# Patient Record
Sex: Female | Born: 1950 | Race: White | Hispanic: No | Smoking: Former smoker
Health system: Southern US, Community
[De-identification: ages and names within clinical notes are randomized; demographics above are authoritative.]

## PROBLEM LIST (undated history)

## (undated) DIAGNOSIS — I219 Acute myocardial infarction, unspecified: Secondary | ICD-10-CM

## (undated) DIAGNOSIS — K5904 Chronic idiopathic constipation: Secondary | ICD-10-CM

## (undated) DIAGNOSIS — M5137 Other intervertebral disc degeneration, lumbosacral region: Secondary | ICD-10-CM

## (undated) DIAGNOSIS — I2510 Atherosclerotic heart disease of native coronary artery without angina pectoris: Secondary | ICD-10-CM

## (undated) DIAGNOSIS — M1990 Unspecified osteoarthritis, unspecified site: Secondary | ICD-10-CM

## (undated) DIAGNOSIS — Z720 Tobacco use: Secondary | ICD-10-CM

## (undated) DIAGNOSIS — M549 Dorsalgia, unspecified: Secondary | ICD-10-CM

## (undated) DIAGNOSIS — E782 Mixed hyperlipidemia: Secondary | ICD-10-CM

## (undated) DIAGNOSIS — R0602 Shortness of breath: Secondary | ICD-10-CM

## (undated) DIAGNOSIS — G8929 Other chronic pain: Secondary | ICD-10-CM

## (undated) DIAGNOSIS — J449 Chronic obstructive pulmonary disease, unspecified: Secondary | ICD-10-CM

## (undated) HISTORY — PX: TONSILLECTOMY: SUR1361

## (undated) HISTORY — PX: OTHER SURGICAL HISTORY: SHX169

## (undated) HISTORY — PX: ABDOMINAL HYSTERECTOMY: SHX81

## (undated) HISTORY — DX: Acute myocardial infarction, unspecified: I21.9

## (undated) HISTORY — DX: Other intervertebral disc degeneration, lumbosacral region: M51.37

## (undated) HISTORY — DX: Tobacco use: Z72.0

## (undated) HISTORY — DX: Other chronic pain: G89.29

## (undated) HISTORY — DX: Dorsalgia, unspecified: M54.9

## (undated) HISTORY — PX: DILATION AND CURETTAGE OF UTERUS: SHX78

## (undated) HISTORY — PX: APPENDECTOMY: SHX54

## (undated) HISTORY — PX: LEG SURGERY: SHX1003

## (undated) HISTORY — PX: CARDIAC CATHETERIZATION: SHX172

## (undated) HISTORY — DX: Mixed hyperlipidemia: E78.2

---

## 1998-08-18 ENCOUNTER — Encounter: Payer: Self-pay | Admitting: Orthopedic Surgery

## 1998-08-18 ENCOUNTER — Ambulatory Visit (HOSPITAL_COMMUNITY): Admission: RE | Admit: 1998-08-18 | Discharge: 1998-08-18 | Payer: Self-pay | Admitting: Orthopedic Surgery

## 1998-09-01 ENCOUNTER — Encounter: Payer: Self-pay | Admitting: Orthopedic Surgery

## 1998-09-01 ENCOUNTER — Ambulatory Visit (HOSPITAL_COMMUNITY): Admission: RE | Admit: 1998-09-01 | Discharge: 1998-09-01 | Payer: Self-pay | Admitting: Orthopedic Surgery

## 1998-09-15 ENCOUNTER — Ambulatory Visit (HOSPITAL_COMMUNITY): Admission: RE | Admit: 1998-09-15 | Discharge: 1998-09-15 | Payer: Self-pay | Admitting: Orthopedic Surgery

## 1998-09-15 ENCOUNTER — Encounter: Payer: Self-pay | Admitting: Orthopedic Surgery

## 1999-02-04 ENCOUNTER — Other Ambulatory Visit: Admission: RE | Admit: 1999-02-04 | Discharge: 1999-02-04 | Payer: Self-pay | Admitting: Obstetrics and Gynecology

## 2001-04-06 ENCOUNTER — Other Ambulatory Visit: Admission: RE | Admit: 2001-04-06 | Discharge: 2001-04-06 | Payer: Self-pay | Admitting: Obstetrics and Gynecology

## 2001-04-26 HISTORY — PX: OTHER SURGICAL HISTORY: SHX169

## 2002-10-22 ENCOUNTER — Other Ambulatory Visit: Admission: RE | Admit: 2002-10-22 | Discharge: 2002-10-22 | Payer: Self-pay | Admitting: Obstetrics and Gynecology

## 2013-08-02 ENCOUNTER — Telehealth: Payer: Self-pay | Admitting: Critical Care Medicine

## 2013-08-02 NOTE — Telephone Encounter (Signed)
Schedules a con appt w/ PW in [place] for 08/14/13 @ 9:30 AM.  Aware to have pt arrive 15 mins early & to bring any current meds.  Verbalized understanding & states nothing further needed at this time.  [name]

## 2013-08-14 ENCOUNTER — Encounter (HOSPITAL_COMMUNITY): Payer: Self-pay | Admitting: Pharmacy Technician

## 2013-08-14 ENCOUNTER — Ambulatory Visit (INDEPENDENT_AMBULATORY_CARE_PROVIDER_SITE_OTHER): Payer: Medicare Other | Admitting: Critical Care Medicine

## 2013-08-14 ENCOUNTER — Encounter: Payer: Self-pay | Admitting: Critical Care Medicine

## 2013-08-14 ENCOUNTER — Institutional Professional Consult (permissible substitution): Payer: Self-pay | Admitting: Internal Medicine

## 2013-08-14 VITALS — BP 142/78 | HR 88 | Temp 97.4°F | Ht 64.0 in | Wt 89.0 lb

## 2013-08-14 DIAGNOSIS — G894 Chronic pain syndrome: Secondary | ICD-10-CM | POA: Insufficient documentation

## 2013-08-14 DIAGNOSIS — J439 Emphysema, unspecified: Secondary | ICD-10-CM | POA: Insufficient documentation

## 2013-08-14 DIAGNOSIS — I2510 Atherosclerotic heart disease of native coronary artery without angina pectoris: Secondary | ICD-10-CM | POA: Insufficient documentation

## 2013-08-14 DIAGNOSIS — R599 Enlarged lymph nodes, unspecified: Secondary | ICD-10-CM

## 2013-08-14 DIAGNOSIS — F17200 Nicotine dependence, unspecified, uncomplicated: Secondary | ICD-10-CM

## 2013-08-14 DIAGNOSIS — E43 Unspecified severe protein-calorie malnutrition: Secondary | ICD-10-CM | POA: Insufficient documentation

## 2013-08-14 DIAGNOSIS — R590 Localized enlarged lymph nodes: Secondary | ICD-10-CM | POA: Insufficient documentation

## 2013-08-14 NOTE — Assessment & Plan Note (Signed)
Hilar and mediastinal lymphadenopathy with associated Copd and emphysema Plan Bronchoscopy with endobronchial u/s  (EBUS) in [place] with Dr Janeece Agee PET scan later

## 2013-08-14 NOTE — Progress Notes (Signed)
Subjective:    Patient ID: Chloe Mosley, female    DOB: 1951/03/03, 63 y.o.   MRN: [id number]  Shortness of Breath This is a new problem. The current episode started more than 1 month ago. Episode frequency: notes dyspnea at rest and exertion. The problem has been gradually worsening. Associated symptoms include chest pain, a fever, rhinorrhea, sputum production and wheezing. Pertinent negatives include no abdominal pain, claudication, coryza, ear pain, headaches, hemoptysis, leg pain, leg swelling, neck pain, orthopnea, PND, rash, sore throat, swollen glands, syncope or vomiting. The symptoms are aggravated by any activity and exercise. Associated symptoms comments: Weight loss 5# in one week Started under Left breast and across chest through ribs . Risk factors include smoking. She has tried beta agonist inhalers for the symptoms. The treatment provided moderate relief. Her past medical history is significant for COPD. There is no history of allergies, aspirin allergies, asthma, bronchiolitis, CAD, chronic lung disease, DVT, a heart failure, PE, pneumonia or a recent surgery.   Chest pain.  Abn CT scan  Pt noting chest pain for 18months, now worse. Notes some cough, green mucus.  Also notes dyspnea. Smokes 2PPD  Could not afford/no insurance for chantix  Past Medical History  Diagnosis Date  . Heart attack   . Chronic back pain      Family History  Problem Relation Age of Onset  . Rheum arthritis Paternal Grandmother   . Lung cancer Son   . Osteoporosis Maternal Grandmother      History   Social History  . Marital Status: Married    Spouse Name: N/A    Number of Children: N/A  . Years of Education: N/A   Occupational History  . Not on file.   Social History Main Topics  . Smoking status: Current Every Day Smoker -- 2.00 packs/day    Types: Cigarettes    Start date: 04/26/1965  . Smokeless tobacco: Never Used  . Alcohol Use: No  . Drug Use: No  . Sexual Activity: Not  on file   Other Topics Concern  . Not on file   Social History Narrative  . No narrative on file     No Known Allergies   No outpatient prescriptions prior to visit.   No facility-administered medications prior to visit.     Review of Systems  Constitutional: Positive for fever, activity change, appetite change, fatigue and unexpected weight change.  HENT: Positive for postnasal drip and rhinorrhea. Negative for ear pain, sinus pressure, sneezing and sore throat.   Respiratory: Positive for cough, sputum production, chest tightness, shortness of breath and wheezing. Negative for hemoptysis and choking.   Cardiovascular: Positive for chest pain. Negative for orthopnea, claudication, leg swelling, syncope and PND.  Gastrointestinal: Negative.  Negative for vomiting and abdominal pain.  Musculoskeletal: Negative for neck pain.  Skin: Negative for rash.  Neurological: Negative for headaches.       Objective:   Physical Exam Filed Vitals:   08/14/13 1315  BP: 142/78  Pulse: 88  Temp: 97.4 F (36.3 C)  TempSrc: Oral  Height: 5\' 4"  (1.626 m)  Weight: 89 lb (40.37 kg)  SpO2: 97%    Gen: Pleasant, thin Female cachectic, in no distress,  normal affect  ENT: No lesions,  mouth clear,  oropharynx clear, no postnasal drip  Neck: No JVD, no TMG, no carotid bruits  Lungs: No use of accessory muscles, no dullness to percussion, distant bs  Cardiovascular: RRR, heart sounds normal, no murmur or  gallops, no peripheral edema  Abdomen: soft and NT, no HSM,  BS normal  Musculoskeletal: No deformities, no cyanosis or clubbing  Neuro: alert, non focal  Skin: Warm, no lesions or rashes  No results found.  07/30/13  Ct chest reviewed: mediastinal and hilar lymphadenopathy Emphysematous changes.  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 Plan No change in medications Use ventolin two puff every 4-6 hours as needed    Updated Medication List Outpatient Encounter Prescriptions as of 08/14/2013  Medication Sig  . albuterol (VENTOLIN HFA) 108 (90 BASE) MCG/ACT inhaler Inhale 2 puffs into the lungs every 4 (four) hours as needed for wheezing or shortness of breath.  . Fluticasone-Salmeterol (ADVAIR) 250-50 MCG/DOSE AEPB Inhale 1 puff into the lungs 2 (two) times daily.  Marland Kitchen. gabapentin (NEURONTIN) 400 MG capsule Take 400 mg by mouth 4 (four) times daily.  Marland Kitchen. morphine (MS CONTIN) 60 MG 12 hr tablet Take 60 mg by mouth 3 (three) times daily.  Marland Kitchen. oxyCODONE (ROXICODONE) 15 MG immediate release tablet Take 15 mg by mouth 4 (four) times daily.  Marland Kitchen. tiotropium (SPIRIVA) 18 MCG inhalation capsule Place 18 mcg into inhaler and inhale daily.

## 2013-08-14 NOTE — Patient Instructions (Addendum)
No change in medications A bronchoscopy in [place] will be arranged with Dr Clair Gulling Work on smoking cessation therapy with nicotine replacement Return 1 month

## 2013-08-14 NOTE — Assessment & Plan Note (Signed)
Tobacco cessation counseling issued

## 2013-08-14 NOTE — Assessment & Plan Note (Signed)
Primary emphysema on spiriva/ advair. Plan No change in medications Use ventolin two puff every 4-6 hours as needed

## 2013-08-14 NOTE — Assessment & Plan Note (Signed)
Severe prot cal malnutrition Plan  Nutritional supp

## 2013-08-15 NOTE — H&P (Signed)
INTERVENTIONAL PULMONOLOGY CONSULTATION  Name: Chloe Mosley MRN: [id number] DOB: July 22, 1950    CONSULTATION DATE:  08/17/2013  REFERRING MD :  Dr. Delford Field  CHIEF COMPLAINT:  Mediastinal / hilar lymphadenopathy  HISTORY OF PRESENT ILLNESS:  63 yo active smoker ( 2 packs per day ) with COPD and baseline dyspnea at rest / worse on exertion, productive cough, wheezing and chest wall pain.  Denies hemoptysis.  Reports unspecified weight loss. Noted to have mediastinal / hilar ;ymphadenopathy on chest CT scan dated 07/30/2013.  No such changes noted on chest CT dated 06/27/2009.  PAST MEDICAL HISTORY :  Past Medical History  Diagnosis Date  . Heart attack   . Chronic back pain   . Coronary artery disease   . COPD (chronic obstructive pulmonary disease)   . Shortness of breath   . Arthritis   . Constipation - functional    Past Surgical History  Procedure Laterality Date  . Ovarain cysts      x 3  . Stents place  2003  . Hysterectomy    . Appendectomy    . Dilation and curettage of uterus      x 3  . Leg surgery    . Abdominal hysterectomy    . Cardiac catheterization    . Tonsillectomy     Prior to Admission medications   Medication Sig Start Date End Date Taking? Authorizing Provider  albuterol (VENTOLIN HFA) 108 (90 BASE) MCG/ACT inhaler Inhale 2 puffs into the lungs every 4 (four) hours as needed for wheezing or shortness of breath.   Yes Historical Provider, MD  Fluticasone-Salmeterol (ADVAIR) 250-50 MCG/DOSE AEPB Inhale 1 puff into the lungs 2 (two) times daily.   Yes Historical Provider, MD  gabapentin (NEURONTIN) 400 MG capsule Take 400 mg by mouth 4 (four) times daily.   Yes Historical Provider, MD  morphine (MS CONTIN) 60 MG 12 hr tablet Take 60 mg by mouth 3 (three) times daily.   Yes Historical Provider, MD  oxyCODONE (ROXICODONE) 15 MG immediate release tablet Take 15 mg by mouth 4 (four) times daily.   Yes Historical Provider, MD  tiotropium (SPIRIVA) 18 MCG inhalation  capsule Place 18 mcg into inhaler and inhale daily.   Yes Historical Provider, MD   Allergies  Allergen Reactions  . Chlorhexidine Gluconate Itching    FAMILY HISTORY:  Family History  Problem Relation Age of Onset  . Rheum arthritis Paternal Grandmother   . Lung cancer Son   . Osteoporosis Maternal Grandmother    SOCIAL HISTORY:  reports that she has been smoking Cigarettes.  She started smoking about 48 years ago. She has a 80 pack-year smoking history. She has never used smokeless tobacco. She reports that she does not drink alcohol or use illicit drugs.  REVIEW OF SYSTEMS:   Constitutional: Positive for fatigue, weight loss. Negative for fever, chills, and diaphoresis.  HENT: Positive for nasal discharge and postnasal drip. Negative for hearing loss, ear pain, nosebleeds, congestion, sore throat, neck pain, tinnitus and ear discharge.   Eyes: Negative for blurred vision, double vision, photophobia, pain, discharge and redness.  Respiratory: Positive for productive cough, dyspnea at rest and on exertion, wheezing. Negative hemoptysis, and stridor.   Cardiovascular: Negative for chest pain, palpitations, orthopnea, claudication, leg swelling and PND.  Gastrointestinal: Negative for heartburn, nausea, vomiting, abdominal pain, diarrhea, constipation, blood in stool and melena.  Genitourinary: Negative for dysuria, urgency, frequency, hematuria and flank pain.  Musculoskeletal: Positive for chest wall pain. Negative for  myalgias, back pain, joint pain and falls.  Skin: Negative for itching and rash.  Neurological: Negative for dizziness, tingling, tremors, sensory change, speech change, focal weakness, seizures, loss of consciousness, weakness and headaches.  Endo/Heme/Allergies: Negative for environmental allergies and polydipsia. Does not bruise/bleed easily.  VITAL SIGNS: Temp:  [97.6 F (36.4 C)] 97.6 F (36.4 C) (04/24 1322) Pulse Rate:  [92] 92 (04/24 1322) Resp:  [18] 18  (04/24 1322) BP: (164)/(77) 164/77 mmHg (04/24 1322) SpO2:  [100 %] 100 % (04/24 1322) Weight:  [40.824 kg (90 lb)] 40.824 kg (90 lb) (04/24 1322)  PHYSICAL EXAMINATION: General:  Resting comfortably, appears to be in no acute distress Neuro:  Awake, alert, cooperative with examination, non-focal HEENT:  NCAT, PERRL, moist membranes Neck:  Soft, no bruits, no lymphadenopathy, no carotid btuits Cardiovascular:  RRR, no m/r/g Lungs:  Bilateral air entry, no added sounds Abdomen:  Soft, nontender, bowel sounds present, no organomegaly Musculoskeletal:  Moves all extremities, no edema, no digital clubbing Skin:  Intact, no rash  Recent Labs Lab 08/16/13 1407  NA 136*  K 4.3  CL 97  CO2 25  BUN 7  CREATININE 0.58  GLUCOSE 100*    Recent Labs Lab 08/16/13 1407  HGB 15.2*  HCT 43.5  WBC 13.4*  PLT 444*   IMAGING:  07/30/2013  CT scan >>> No pulmonary embolism.  Extensive emphysema.  Since the prior CT scan, the patient has developed mediastinal and hilar lymphadenopathy. Right hilar node on image 54 measures 2.5 x 1.5 cm. Subcarinal node on image 56 measures 3.3 x 1.6 cm. AP window lymph node on image 43 measures 1.1 x 1.2 cm. Left hilar lymph node on image 55 measures 1.3 x 1.2 cm.    ASSESSMENT / PLAN:  Extensive mediastinal hilar adenopathy, highly suspicious for primary bronchogenic carcinoma ( metastatic disease from extrapulmonary source, lymphoma, or reactive lymphadenopathy are also possible ) COPD without evidence of exacerbation Tobacco use disorder   EBUS-guided TBNA of bilateral hilar and subcarinal lymph nodes  May need to send samples for flow cytometry  Smoking cessation  COPD management per Dr. Delford FieldWright   I have personally obtained history, examined patient, evaluated and interpreted laboratory and imaging results, reviewed medical records, formulated assessment / plan and placed orders.  Lonia FarberKonstantin Rhyanna Sorce, MD Pulmonary and Critical Care  Medicine [facility] Pager: [phone]  08/17/2013, 2:00 PM

## 2013-08-16 ENCOUNTER — Ambulatory Visit (HOSPITAL_COMMUNITY)
Admission: RE | Admit: 2013-08-16 | Discharge: 2013-08-16 | Disposition: A | Discharge status: Home / Self Care | Payer: Medicare Other | Source: Ambulatory Visit | Attending: Anesthesiology | Admitting: Anesthesiology

## 2013-08-16 ENCOUNTER — Encounter (HOSPITAL_COMMUNITY): Payer: Self-pay

## 2013-08-16 ENCOUNTER — Encounter (HOSPITAL_COMMUNITY)
Admission: RE | Admit: 2013-08-16 | Discharge: 2013-08-16 | Disposition: A | Discharge status: Home / Self Care | Payer: Medicare Other | Source: Ambulatory Visit | Attending: Pulmonary Disease | Admitting: Pulmonary Disease

## 2013-08-16 DIAGNOSIS — Z01818 Encounter for other preprocedural examination: Secondary | ICD-10-CM

## 2013-08-16 DIAGNOSIS — Z01812 Encounter for preprocedural laboratory examination: Secondary | ICD-10-CM | POA: Insufficient documentation

## 2013-08-16 DIAGNOSIS — Z01810 Encounter for preprocedural cardiovascular examination: Secondary | ICD-10-CM | POA: Insufficient documentation

## 2013-08-16 HISTORY — DX: Chronic obstructive pulmonary disease, unspecified: J44.9

## 2013-08-16 HISTORY — DX: Atherosclerotic heart disease of native coronary artery without angina pectoris: I25.10

## 2013-08-16 HISTORY — DX: Shortness of breath: R06.02

## 2013-08-16 HISTORY — DX: Chronic idiopathic constipation: K59.04

## 2013-08-16 HISTORY — DX: Unspecified osteoarthritis, unspecified site: M19.90

## 2013-08-16 LAB — COMPREHENSIVE METABOLIC PANEL
ALBUMIN: 3.2 g/dL — AB (ref 3.5–5.2)
ALT: 10 U/L (ref 0–35)
AST: 16 U/L (ref 0–37)
Alkaline Phosphatase: 88 U/L (ref 39–117)
BUN: 7 mg/dL (ref 6–23)
CO2: 25 mEq/L (ref 19–32)
CREATININE: 0.58 mg/dL (ref 0.50–1.10)
Calcium: 8.8 mg/dL (ref 8.4–10.5)
Chloride: 97 mEq/L (ref 96–112)
GFR calc non Af Amer: >90 mL/min (ref >90)
GLUCOSE: 100 mg/dL — AB (ref 70–99)
Potassium: 4.3 mEq/L (ref 3.7–5.3)
Sodium: 136 mEq/L — ABNORMAL LOW (ref 137–147)
Total Bilirubin: 0.2 mg/dL — ABNORMAL LOW (ref 0.3–1.2)
Total Protein: 7 g/dL (ref 6.0–8.3)

## 2013-08-16 LAB — APTT: APTT: 30 s (ref 24–37)

## 2013-08-16 LAB — PROTIME-INR
INR: 1.03 (ref 0.00–1.49)
Prothrombin Time: 13.3 seconds (ref 11.6–15.2)

## 2013-08-16 LAB — CBC
HEMATOCRIT: 43.5 % (ref 36.0–46.0)
Hemoglobin: 15.2 g/dL — ABNORMAL HIGH (ref 12.0–15.0)
MCH: 30 pg (ref 26.0–34.0)
MCHC: 34.9 g/dL (ref 30.0–36.0)
MCV: 86 fL (ref 78.0–100.0)
Platelets: 444 10*3/uL — ABNORMAL HIGH (ref 150–400)
RBC: 5.06 MIL/uL (ref 3.87–5.11)
RDW: 14.8 % (ref 11.5–15.5)
WBC: 13.4 10*3/uL — ABNORMAL HIGH (ref 4.0–10.5)

## 2013-08-16 NOTE — Progress Notes (Signed)
Anesthesia Chart Review:  Patient is a 63 year old female scheduled for bronchoscopy, endobronchial ultrasound, biopsy tomorrow by Dr. Marin Shutter. Her PAT appointment was at 2 PM today. ( I was not asked to evaluate patient at her PAT visit but was given her chart to review.  I did call patient at home to obtain more information regarding her past cardiac history and where to obtain records.)  History includes  CAD s/p mid RCA Cypher stent on 02/28/02 at [facility], smoking (2PPD), COPD, severe protein calorie malnutrition (BMI 15), hysterectomy, tonsillectomy. PCP is listed as Dr. Charlott Rakes. Primary pulmonologist is Dr. Shan Levans. She was previously seeing a cardiologist at [facility] in [place], but hasn't been in approximately 4 years.     Her last cardiac cath was on 01/20/10 ([facility]) and showed: minimal CAD (NL LM, LAD, 10% mid CX), old RCA stent widely patent, hyperdynamic LV function, EF 80%.   She was seen at [facility] on 07/30/13 for chest pain. She's not completely sure what test she had, but said that they told her that her symptoms were not felt related to her heart.  Instead, symptoms were felt more pulmonary related as her chest CT showed: Negative for pulmonary embolism. Mediastinal and hilar lymphadenopathy is new since 2011 CT scan and nonspecific. It could be secondary to lymphoproliferative disease or possibly reactive change. Sarcoidosis is also within the differential. Extensive certrilobular emphysema. Bronchitic change is also identified. Atherosclerosis. She was subsequently referred to Dr. Shan Levans who recommended bronchoscopy with biopsies for definitive diagnosis.    EKG on 08/16/13 showed: NSR, possible LAE, non-specific ST abnormality.  Preoperative CXR and labs noted.  Records from [facility] requested but are still pending.  I reviewed current available information with anesthesiologist Dr. Jacklynn Bue.  Since last cardiac cath in  2011 showed patent stent with otherwise minimal CAD then likely patient can proceed.  However, final decision following evaluation of patient tomorrow.  Hopefully additional records from [facility] will be available by then.  Velna Ochs [facility] Short Stay Center/Anesthesiology Phone [phone] 08/16/2013 4:30 PM

## 2013-08-16 NOTE — Pre-Procedure Instructions (Signed)
Chloe Mosley  08/16/2013   Your procedure is scheduled on:  Friday, August 17, 2013 at 2:45 PM  Report to [facility] Short Stay (use Main Entrance "A'') at 12:45 PM.  Call this number if you have problems the morning of surgery: [phone]   Remember:   Do not eat food or drink liquids after midnight tonight   Take these medicines the morning of surgery with A SIP OF WATER: gabapentin (NEURONTIN), morphine (MS CONTIN), oxyCODONE (ROXICODONE), tiotropium (SPIRIVA), if needed: albuterol (VENTOLIN HFA) 108 (90 BASE) ( Bring inhaler with you on day of procedure).   Do not wear jewelry, make-up or nail polish.  Do not wear lotions, powders, or perfumes. You may NOT wear deodorant.  Do not shave 48 hours prior to surgery.   Do not bring valuables to the hospital.  [facility] is not responsible  for any belongings or valuables.               Contacts, dentures or bridgework may not be worn into surgery.  Leave suitcase in the car. After surgery it may be brought to your room.  For patients admitted to the hospital, discharge time is determined by your treatment team.               Patients discharged the day of surgery will not be allowed to drive home.  Name and phone number of your driver:   Special Instructions:  Special Instructions:Special Instructions: [facility] - Preparing for Surgery  Before surgery, you can play an important role.  Because skin is not sterile, your skin needs to be as free of germs as possible.  You can reduce the number of germs on you skin by washing with CHG (chlorahexidine gluconate) soap before surgery.  CHG is an antiseptic cleaner which kills germs and bonds with the skin to continue killing germs even after washing.  Please DO NOT use if you have an allergy to CHG or antibacterial soaps.  If your skin becomes reddened/irritated stop using the CHG and inform your nurse when you arrive at Short Stay.  Do not shave (including legs and underarms) for at least  48 hours prior to the first CHG shower.  You may shave your face.  Please follow these instructions carefully:   1.  Shower with CHG Soap the night before surgery and the morning of Surgery.  2.  If you choose to wash your hair, wash your hair first as usual with your normal shampoo.  3.  After you shampoo, rinse your hair and body thoroughly to remove the Shampoo.  4.  Use CHG as you would any other liquid soap.  You can apply chg directly  to the skin and wash gently with scrungie or a clean washcloth.  5.  Apply the CHG Soap to your body ONLY FROM THE NECK DOWN.  Do not use on open wounds or open sores.  Avoid contact with your eyes, ears, mouth and genitals (private parts).  Wash genitals (private parts) with your normal soap.  6.  Wash thoroughly, paying special attention to the area where your surgery will be performed.  7.  Thoroughly rinse your body with warm water from the neck down.  8.  DO NOT shower/wash with your normal soap after using and rinsing off the CHG Soap.  9.  Pat yourself dry with a clean towel.            10.  Wear clean pajamas.  11.  Place clean sheets on your bed the night of your first shower and do not sleep with pets.  Day of Surgery  Do not apply any lotions/deodorants the morning of surgery.  Please wear clean clothes to the hospital/surgery center.   Please read over the following fact sheets that you were given: Pain Booklet, Coughing and Deep Breathing and Surgical Site Infection Prevention

## 2013-08-17 ENCOUNTER — Encounter (HOSPITAL_COMMUNITY)
Admission: RE | Disposition: A | Discharge status: Home / Self Care | Payer: Self-pay | Source: Ambulatory Visit | Attending: Pulmonary Disease

## 2013-08-17 ENCOUNTER — Ambulatory Visit (HOSPITAL_COMMUNITY): Payer: Medicare Other | Admitting: Anesthesiology

## 2013-08-17 ENCOUNTER — Encounter (HOSPITAL_COMMUNITY): Payer: Medicare Other | Admitting: Vascular Surgery

## 2013-08-17 ENCOUNTER — Ambulatory Visit (HOSPITAL_COMMUNITY)
Admission: RE | Admit: 2013-08-17 | Discharge: 2013-08-17 | Disposition: A | Discharge status: Home / Self Care | Payer: Medicare Other | Source: Ambulatory Visit | Attending: Pulmonary Disease | Admitting: Pulmonary Disease

## 2013-08-17 ENCOUNTER — Encounter (HOSPITAL_COMMUNITY): Payer: Self-pay | Admitting: Anesthesiology

## 2013-08-17 DIAGNOSIS — Z01812 Encounter for preprocedural laboratory examination: Secondary | ICD-10-CM | POA: Insufficient documentation

## 2013-08-17 DIAGNOSIS — Z01810 Encounter for preprocedural cardiovascular examination: Secondary | ICD-10-CM | POA: Insufficient documentation

## 2013-08-17 DIAGNOSIS — R599 Enlarged lymph nodes, unspecified: Secondary | ICD-10-CM

## 2013-08-17 DIAGNOSIS — R0602 Shortness of breath: Secondary | ICD-10-CM | POA: Insufficient documentation

## 2013-08-17 DIAGNOSIS — I252 Old myocardial infarction: Secondary | ICD-10-CM | POA: Insufficient documentation

## 2013-08-17 DIAGNOSIS — F17200 Nicotine dependence, unspecified, uncomplicated: Secondary | ICD-10-CM | POA: Insufficient documentation

## 2013-08-17 DIAGNOSIS — J438 Other emphysema: Secondary | ICD-10-CM | POA: Insufficient documentation

## 2013-08-17 DIAGNOSIS — I2510 Atherosclerotic heart disease of native coronary artery without angina pectoris: Secondary | ICD-10-CM | POA: Insufficient documentation

## 2013-08-17 DIAGNOSIS — R590 Localized enlarged lymph nodes: Secondary | ICD-10-CM

## 2013-08-17 DIAGNOSIS — Z01818 Encounter for other preprocedural examination: Secondary | ICD-10-CM | POA: Insufficient documentation

## 2013-08-17 HISTORY — PX: VIDEO BRONCHOSCOPY WITH ENDOBRONCHIAL ULTRASOUND: SHX6177

## 2013-08-17 SURGERY — BRONCHOSCOPY, WITH EBUS
Anesthesia: General

## 2013-08-17 MED ORDER — GLYCOPYRROLATE 0.2 MG/ML IJ SOLN
INTRAMUSCULAR | Status: DC | PRN
  Administered 2013-08-17: 0.4 mg via INTRAVENOUS

## 2013-08-17 MED ORDER — LACTATED RINGERS IV SOLN
INTRAVENOUS | Status: DC
  Administered 2013-08-17: 13:00:00 via INTRAVENOUS

## 2013-08-17 MED ORDER — PROPOFOL 10 MG/ML IV BOLUS
INTRAVENOUS | Status: AC
  Filled 2013-08-17: qty 20

## 2013-08-17 MED ORDER — OXYCODONE HCL 5 MG PO TABS
ORAL_TABLET | ORAL | Status: AC
  Filled 2013-08-17: qty 1

## 2013-08-17 MED ORDER — PHENYLEPHRINE 40 MCG/ML (10ML) SYRINGE FOR IV PUSH (FOR BLOOD PRESSURE SUPPORT)
PREFILLED_SYRINGE | INTRAVENOUS | Status: AC
  Filled 2013-08-17: qty 10

## 2013-08-17 MED ORDER — FENTANYL CITRATE 0.05 MG/ML IJ SOLN
INTRAMUSCULAR | Status: AC
  Filled 2013-08-17: qty 5

## 2013-08-17 MED ORDER — ONDANSETRON HCL 4 MG/2ML IJ SOLN: 4.0000 mg | Freq: Once | INTRAMUSCULAR | Status: DC | PRN

## 2013-08-17 MED ORDER — MEPERIDINE HCL 25 MG/ML IJ SOLN: 6.2500 mg | INTRAMUSCULAR | Status: DC | PRN

## 2013-08-17 MED ORDER — PHENYLEPHRINE HCL 10 MG/ML IJ SOLN
INTRAMUSCULAR | Status: DC | PRN
  Administered 2013-08-17: 40 ug via INTRAVENOUS
  Administered 2013-08-17: 80 ug via INTRAVENOUS
  Administered 2013-08-17 (×2): 40 ug via INTRAVENOUS
  Administered 2013-08-17 (×4): 80 ug via INTRAVENOUS
  Administered 2013-08-17: 120 ug via INTRAVENOUS
  Administered 2013-08-17: 80 ug via INTRAVENOUS

## 2013-08-17 MED ORDER — MIDAZOLAM HCL 5 MG/5ML IJ SOLN
INTRAMUSCULAR | Status: DC | PRN
  Administered 2013-08-17: 2 mg via INTRAVENOUS

## 2013-08-17 MED ORDER — 0.9 % SODIUM CHLORIDE (POUR BTL) OPTIME
TOPICAL | Status: DC | PRN
  Administered 2013-08-17: 1000 mL

## 2013-08-17 MED ORDER — EPHEDRINE SULFATE 50 MG/ML IJ SOLN
INTRAMUSCULAR | Status: DC | PRN
  Administered 2013-08-17: 10 mg via INTRAVENOUS

## 2013-08-17 MED ORDER — NEOSTIGMINE METHYLSULFATE 1 MG/ML IJ SOLN
INTRAMUSCULAR | Status: DC | PRN
  Administered 2013-08-17: 3 mg via INTRAVENOUS

## 2013-08-17 MED ORDER — MIDAZOLAM HCL 2 MG/2ML IJ SOLN
INTRAMUSCULAR | Status: AC
  Filled 2013-08-17: qty 2

## 2013-08-17 MED ORDER — OXYCODONE HCL 5 MG/5ML PO SOLN: 5.0000 mg | Freq: Once | ORAL | Status: AC | PRN

## 2013-08-17 MED ORDER — PROPOFOL 10 MG/ML IV BOLUS
INTRAVENOUS | Status: DC | PRN
  Administered 2013-08-17: 100 mg via INTRAVENOUS

## 2013-08-17 MED ORDER — HYDROMORPHONE HCL PF 1 MG/ML IJ SOLN: 0.2500 mg | INTRAMUSCULAR | Status: DC | PRN

## 2013-08-17 MED ORDER — ROCURONIUM BROMIDE 100 MG/10ML IV SOLN
INTRAVENOUS | Status: DC | PRN
  Administered 2013-08-17: 5 mg via INTRAVENOUS
  Administered 2013-08-17: 35 mg via INTRAVENOUS

## 2013-08-17 MED ORDER — FENTANYL CITRATE 0.05 MG/ML IJ SOLN
INTRAMUSCULAR | Status: DC | PRN
  Administered 2013-08-17 (×2): 50 ug via INTRAVENOUS

## 2013-08-17 MED ORDER — OXYCODONE HCL 5 MG PO TABS
5.0000 mg | ORAL_TABLET | Freq: Once | ORAL | Status: AC | PRN
  Administered 2013-08-17: 5 mg via ORAL

## 2013-08-17 MED ORDER — LACTATED RINGERS IV SOLN
INTRAVENOUS | Status: DC | PRN
  Administered 2013-08-17 (×2): via INTRAVENOUS

## 2013-08-17 MED ORDER — LIDOCAINE HCL (CARDIAC) 20 MG/ML IV SOLN
INTRAVENOUS | Status: DC | PRN
  Administered 2013-08-17: 100 mg via INTRAVENOUS

## 2013-08-17 MED ORDER — ONDANSETRON HCL 4 MG/2ML IJ SOLN
INTRAMUSCULAR | Status: DC | PRN
  Administered 2013-08-17: 4 mg via INTRAVENOUS

## 2013-08-17 MED ORDER — LIDOCAINE HCL 4 % MT SOLN
OROMUCOSAL | Status: DC | PRN
  Administered 2013-08-17: 4 mL via TOPICAL

## 2013-08-17 SURGICAL SUPPLY — 27 items
BRUSH CYTOL CELLEBRITY 1.5X140 (MISCELLANEOUS) IMPLANT
CANISTER SUCTION 2500CC (MISCELLANEOUS) ×3 IMPLANT
CONT SPEC 4OZ CLIKSEAL STRL BL (MISCELLANEOUS) ×3 IMPLANT
COVER TABLE BACK 60X90 (DRAPES) ×3 IMPLANT
FORCEPS BIOP RJ4 1.8 (CUTTING FORCEPS) IMPLANT
GLOVE BIO SURGEON STRL SZ7 (GLOVE) ×3 IMPLANT
GLOVE SURG SIGNA 7.5 PF LTX (GLOVE) ×3 IMPLANT
GLOVE SURG SS PI 7.0 STRL IVOR (GLOVE) ×6 IMPLANT
GOWN STRL REUS W/ TWL LRG LVL3 (GOWN DISPOSABLE) ×3 IMPLANT
GOWN STRL REUS W/ TWL XL LVL3 (GOWN DISPOSABLE) ×2 IMPLANT
GOWN STRL REUS W/TWL LRG LVL3 (GOWN DISPOSABLE) ×6
GOWN STRL REUS W/TWL XL LVL3 (GOWN DISPOSABLE) ×4
KIT ROOM TURNOVER OR (KITS) ×3 IMPLANT
MARKER SKIN DUAL TIP RULER LAB (MISCELLANEOUS) ×3 IMPLANT
NEEDLE BIOPSY TRANSBRONCH 21G (NEEDLE) IMPLANT
NEEDLE SYS SONOTIP II EBUSTBNA (NEEDLE) ×3 IMPLANT
NS IRRIG 1000ML POUR BTL (IV SOLUTION) ×3 IMPLANT
OIL SILICONE PENTAX (PARTS (SERVICE/REPAIRS)) IMPLANT
PAD ARMBOARD 7.5X6 YLW CONV (MISCELLANEOUS) ×6 IMPLANT
SPONGE GAUZE 4X4 12PLY (GAUZE/BANDAGES/DRESSINGS) ×3 IMPLANT
SYR 20CC LL (SYRINGE) ×3 IMPLANT
SYR 20ML ECCENTRIC (SYRINGE) ×3 IMPLANT
SYR 50ML SLIP (SYRINGE) IMPLANT
TOWEL OR 17X24 6PK STRL BLUE (TOWEL DISPOSABLE) ×3 IMPLANT
TRAP SPECIMEN MUCOUS 40CC (MISCELLANEOUS) ×3 IMPLANT
TUBE CONNECTING 20'X1/4 (TUBING) ×1
TUBE CONNECTING 20X1/4 (TUBING) ×2 IMPLANT

## 2013-08-17 NOTE — Progress Notes (Signed)
       intraoperative images

## 2013-08-17 NOTE — Transfer of Care (Signed)
Immediate Anesthesia Transfer of Care Note  Patient: Chloe Mosley  Procedure(s) Performed: Procedure(s): VIDEO BRONCHOSCOPY WITH ENDOBRONCHIAL ULTRASOUND (N/A)  Patient Location: PACU  Anesthesia Type:General  Level of Consciousness: awake, alert , oriented and patient cooperative  Airway & Oxygen Therapy: Patient Spontanous Breathing and Patient connected to nasal cannula oxygen  Post-op Assessment: Report given to PACU RN and Post -op Vital signs reviewed and stable  Post vital signs: Reviewed and stable  Complications: No apparent anesthesia complications

## 2013-08-17 NOTE — Op Note (Signed)
Name:  [name] MRN:  [id number] DOB:  [birth year]  PROCEDURE NOTE  Procedure(s): Flexible bronchoscopy [phone]) Endobronchial ultrasound (43539) Transbronchial needle aspiration (12258) of station 10L LN Transbronchial needle aspiration, additional lobe (31633 x 2) of station 7 and station 10R LN  Indications:  Hilar / mediastinal lymphadenopathy.  Consent:  Procedure, benefits, risks and alternatives discussed.  Questions answered.  Consent obtained.  Anesthesia:  General endotracheal.  Procedure summary:  Appropriate equipment was assembled.  The patient was brought to the operating room and identified as Chloe Mosley.  Safety timeout was performed. The patient was placed supine on the operating table, airway established and general anesthesia administered by Anesthesia team.   After the appropriate level of anesthesia was assured, flexible video bronchoscope was lubricated and inserted through the endotracheal tube.   Airway examination was performed bilaterally to subsegmental level.  Minimal clear secretions were noted, mucosa appeared normal and no endobronchial lesions were identified.  Endobronchial ultrasound video bronchoscope was then lubricated and inserted through the endotracheal tube. Surveillance of the mediastinal and and bilateral hilar lymph node stations was performed.  Both hilar lymph nodes were small, subcarinal node was enlarged and vascular sonographically.   Endobronchial ultrasound guided transbronchial needle aspiration of stations 10L, 7 and 10R  LN was performed, after which EBUS bronchoscope was withdrawn.  After hemostasis was assure, the bronchoscope was withdrawn.  The patient was extubated in operating room and transferred to PACU.  Specimens sent: EBUS-TBNA sample of of stations 10L, 7 and 10R  Complications:  No immediate complications were noted.  Hemodynamic parameters and oxygenation remained stable throughout the procedure.  Estimated  blood loss:  Less then 5 mL.  Orlean Bradford, M.D. Pulmonary and Critical Care Medicine [facility] Cell: [phone]  08/17/2013, 3:51 PM

## 2013-08-17 NOTE — Anesthesia Postprocedure Evaluation (Signed)
Anesthesia Post Note  Patient: Chloe Mosley  Procedure(s) Performed: Procedure(s) (LRB): VIDEO BRONCHOSCOPY WITH ENDOBRONCHIAL ULTRASOUND (N/A)  Anesthesia type: General  Patient location: PACU  Post pain: Pain level controlled and Adequate analgesia  Post assessment: Post-op Vital signs reviewed, Patient's Cardiovascular Status Stable, Respiratory Function Stable, Patent Airway and Pain level controlled  Last Vitals:  Filed Vitals:   08/17/13 1600  BP: 114/65  Pulse: 92  Temp:   Resp: 21    Post vital signs: Reviewed and stable  Level of consciousness: awake, alert  and oriented  Complications: No apparent anesthesia complications

## 2013-08-17 NOTE — Anesthesia Preprocedure Evaluation (Signed)
Anesthesia Evaluation  Patient identified by MRN, date of birth, ID band Patient awake    Reviewed: Allergy & Precautions, H&P , NPO status , Patient's Chart, lab work & pertinent test results  Airway Mallampati: I TM Distance: >3 FB Neck ROM: Full    Dental   Pulmonary Current Smoker,          Cardiovascular     Neuro/Psych    GI/Hepatic   Endo/Other    Renal/GU      Musculoskeletal   Abdominal   Peds  Hematology   Anesthesia Other Findings   Reproductive/Obstetrics                           Anesthesia Physical Anesthesia Plan  ASA: III  Anesthesia Plan: General   Post-op Pain Management:    Induction: Intravenous  Airway Management Planned: Oral ETT  Additional Equipment:   Intra-op Plan:   Post-operative Plan: Extubation in OR  Informed Consent: I have reviewed the patients History and Physical, chart, labs and discussed the procedure including the risks, benefits and alternatives for the proposed anesthesia with the patient or authorized representative who has indicated his/her understanding and acceptance.     Plan Discussed with: CRNA and Surgeon  Anesthesia Plan Comments:         Anesthesia Quick Evaluation

## 2013-08-20 ENCOUNTER — Telehealth: Payer: Self-pay | Admitting: Pulmonary Disease

## 2013-08-20 DIAGNOSIS — R590 Localized enlarged lymph nodes: Secondary | ICD-10-CM

## 2013-08-20 NOTE — Telephone Encounter (Signed)
In the EBUS specimens resulted today no malignant cells were identified.  Discussed with patient.  Emphasized that negative biopsy result is not the same as absence of malignancy.  Will have to follow with Dr. Delford Field.  Based on patient's preference may need mediastinoscopy vs short term imaging follow up.  Lonia Farber, MD Pulmonary and Critical Care Medicine [facility] Pager: [phone]

## 2013-08-21 ENCOUNTER — Encounter (HOSPITAL_COMMUNITY): Payer: Self-pay | Admitting: Pulmonary Disease

## 2013-08-22 ENCOUNTER — Encounter: Payer: Self-pay | Admitting: Critical Care Medicine

## 2013-08-22 NOTE — Telephone Encounter (Signed)
i spoke to the pt as well.  Plan PET scan as next step  Crystal: pls schedule PET scan at [facility]

## 2013-08-22 NOTE — Addendum Note (Signed)
Addended by: Gweneth Dimitri D on: 08/22/2013 04:18 PM   Modules accepted: Orders

## 2013-08-22 NOTE — Telephone Encounter (Signed)
PET scan order placed to be done at [facility]. Pt is aware order has been placed and PCCs will be calling her to schedule. She verbalized understanding and voiced no further questions or concerns at this time.

## 2013-08-29 ENCOUNTER — Telehealth: Payer: Self-pay | Admitting: Critical Care Medicine

## 2013-08-29 DIAGNOSIS — R590 Localized enlarged lymph nodes: Secondary | ICD-10-CM

## 2013-08-29 MED ORDER — CLARITHROMYCIN 500 MG PO TABS: 500.0000 mg | ORAL_TABLET | Freq: Two times a day (BID) | ORAL | Status: DC

## 2013-08-29 MED ORDER — PREDNISONE 10 MG PO TABS: ORAL_TABLET | ORAL | Status: DC

## 2013-08-29 NOTE — Telephone Encounter (Signed)
PET scan:  No uptake c/w inflammatory lymphadenopathy and tree in bud pattern on PET/CT Called in 10d biaxin/pred pulse 08/29/2013  Pt aware

## 2013-08-30 NOTE — Telephone Encounter (Signed)
Per PW: make sure pt has a follow up appt with [place]. Pt has a pending OV with [place] on May 19 at 9:15 am in [place]. Called, spoke with pt.  Reminded her of this appt and asked she please keep it and call sooner if needed.  She verbalized understanding and voiced no further questions or concerns at this time.

## 2013-09-03 ENCOUNTER — Ambulatory Visit (HOSPITAL_COMMUNITY): Payer: Medicare Other

## 2013-09-11 ENCOUNTER — Ambulatory Visit: Payer: Self-pay | Admitting: Critical Care Medicine

## 2013-10-09 ENCOUNTER — Ambulatory Visit (INDEPENDENT_AMBULATORY_CARE_PROVIDER_SITE_OTHER): Payer: Medicare Other | Admitting: Critical Care Medicine

## 2013-10-09 ENCOUNTER — Encounter: Payer: Self-pay | Admitting: Critical Care Medicine

## 2013-10-09 ENCOUNTER — Other Ambulatory Visit: Payer: Self-pay | Admitting: Critical Care Medicine

## 2013-10-09 VITALS — BP 124/68 | HR 92 | Temp 97.9°F | Ht 64.75 in | Wt 88.2 lb

## 2013-10-09 DIAGNOSIS — J438 Other emphysema: Secondary | ICD-10-CM

## 2013-10-09 DIAGNOSIS — J439 Emphysema, unspecified: Secondary | ICD-10-CM

## 2013-10-09 DIAGNOSIS — F17200 Nicotine dependence, unspecified, uncomplicated: Secondary | ICD-10-CM

## 2013-10-09 MED ORDER — CLARITHROMYCIN 500 MG PO TABS
500.0000 mg | ORAL_TABLET | Freq: Two times a day (BID) | ORAL | Status: DC
Start: 2013-10-09 — End: 2018-10-24

## 2013-10-09 NOTE — Patient Instructions (Signed)
Focus on smoking cessation, use nicorette minis 4mg  6-8 per day Take Biaxin one twice a day for 10days Stay on Advair/Spiriva Return 3 months

## 2013-10-09 NOTE — Assessment & Plan Note (Signed)
Copd with acute tracheobronchitis Ongoing tobacco use Plan Focus on smoking cessation, use nicorette minis 4mg  6-8 per day Take Biaxin one twice a day for 10days Stay on Advair/Spiriva Return 3 months

## 2013-10-09 NOTE — Assessment & Plan Note (Signed)
>  10min smoking cessation counseling issued  

## 2013-10-09 NOTE — Progress Notes (Signed)
Subjective:    Patient ID: Chloe Mosley, female    DOB: 05-Feb-1951, 63 y.o.   MRN: [id number]  HPI Chest pain.  Abn CT scan  Pt noting chest pain for 8months, now worse. Notes some cough, green mucus.  Also notes dyspnea. Smokes 2PPD  Could not afford/no insurance for chantix  10/09/2013 Chief Complaint  Patient presents with  . 2 month follow up    Feels SOB may be a little worse since last OV.  Has SOB when doing little activity like walking to the bathroom.  Chest pain is unchanged.  Has prod cough with greenish yellow.  No f/c/s.    pt still coughing and dyspnea is worse.  Pt down to 1.5 PPD.   Pt with prod cough of green mucus.  Using albuterol many times per day. Mucus now is green to yellow.  Pt still with chest pain episodes.     Review of Systems  Constitutional: Positive for activity change, appetite change, fatigue and unexpected weight change.  HENT: Positive for postnasal drip. Negative for sinus pressure and sneezing.   Respiratory: Positive for cough and chest tightness. Negative for choking.   Gastrointestinal: Negative.        Objective:   Physical Exam  Filed Vitals:   10/09/13 1032  BP: 124/68  Pulse: 92  Temp: 97.9 F (36.6 C)  TempSrc: Oral  Height: 5' 4.75" (1.645 m)  Weight: 40.007 kg (88 lb 3.2 oz)  SpO2: 95%    Gen: Pleasant, thin Female cachectic, in no distress,  normal affect  ENT: No lesions,  mouth clear,  oropharynx clear, no postnasal drip  Neck: No JVD, no TMG, no carotid bruits  Lungs: No use of accessory muscles, no dullness to percussion, distant bs Exp wheezes  Cardiovascular: RRR, heart sounds normal, no murmur or gallops, no peripheral edema  Abdomen: soft and NT, no HSM,  BS normal  Musculoskeletal: No deformities, no cyanosis or clubbing  Neuro: alert, non focal  Skin: Warm, no lesions or rashes  No results found.      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 albuterol (VENTOLIN HFA) 108 (90 BASE) MCG/ACT inhaler Inhale 2 puffs into the lungs every 4 (four) hours as needed for wheezing or shortness of breath.  . Fluticasone-Salmeterol (ADVAIR) 250-50 MCG/DOSE AEPB Inhale 1 puff into the lungs 2 (two) times daily.  Marland Kitchen. gabapentin (NEURONTIN) 400 MG capsule Take 400 mg by mouth 4 (four) times daily.  Marland Kitchen. morphine (MS CONTIN) 60 MG 12 hr tablet Take by mouth. Take 60mg  AM and PM 30mg  at noon  . oxyCODONE (ROXICODONE) 15 MG immediate release tablet Take 15 mg by mouth 4 (four) times daily.  Marland Kitchen. tiotropium (SPIRIVA) 18 MCG inhalation capsule Place 18 mcg into inhaler and inhale daily.  . clarithromycin (BIAXIN) 500 MG tablet Take 1 tablet (500 mg total) by mouth 2 (two) times daily.  . [DISCONTINUED] clarithromycin (BIAXIN) 500 MG tablet Take 1 tablet (500 mg total) by mouth 2 (two) times daily.  . [DISCONTINUED] predniSONE (DELTASONE) 10 MG tablet Take  4 tablets daily for 5 days then stop

## 2013-12-27 ENCOUNTER — Encounter: Payer: Self-pay | Admitting: Critical Care Medicine

## 2014-01-30 ENCOUNTER — Ambulatory Visit: Payer: Medicare Other | Admitting: Critical Care Medicine

## 2014-02-26 ENCOUNTER — Ambulatory Visit: Payer: Medicare Other | Admitting: Critical Care Medicine

## 2014-03-05 ENCOUNTER — Ambulatory Visit: Payer: Medicare Other | Admitting: Critical Care Medicine

## 2014-08-01 DIAGNOSIS — G8929 Other chronic pain: Secondary | ICD-10-CM | POA: Diagnosis not present

## 2014-08-13 DIAGNOSIS — M47817 Spondylosis without myelopathy or radiculopathy, lumbosacral region: Secondary | ICD-10-CM | POA: Diagnosis not present

## 2014-08-13 DIAGNOSIS — G8929 Other chronic pain: Secondary | ICD-10-CM | POA: Diagnosis not present

## 2014-08-13 DIAGNOSIS — F1120 Opioid dependence, uncomplicated: Secondary | ICD-10-CM | POA: Diagnosis not present

## 2014-08-13 DIAGNOSIS — M545 Low back pain: Secondary | ICD-10-CM | POA: Diagnosis not present

## 2014-08-13 DIAGNOSIS — M5137 Other intervertebral disc degeneration, lumbosacral region: Secondary | ICD-10-CM | POA: Diagnosis not present

## 2014-09-10 DIAGNOSIS — M545 Low back pain: Secondary | ICD-10-CM | POA: Diagnosis not present

## 2014-09-10 DIAGNOSIS — Z1389 Encounter for screening for other disorder: Secondary | ICD-10-CM | POA: Diagnosis not present

## 2014-09-10 DIAGNOSIS — M47817 Spondylosis without myelopathy or radiculopathy, lumbosacral region: Secondary | ICD-10-CM | POA: Diagnosis not present

## 2014-09-10 DIAGNOSIS — G8929 Other chronic pain: Secondary | ICD-10-CM | POA: Diagnosis not present

## 2014-09-10 DIAGNOSIS — M5137 Other intervertebral disc degeneration, lumbosacral region: Secondary | ICD-10-CM | POA: Diagnosis not present

## 2014-10-08 DIAGNOSIS — M5137 Other intervertebral disc degeneration, lumbosacral region: Secondary | ICD-10-CM | POA: Diagnosis not present

## 2014-10-08 DIAGNOSIS — G8929 Other chronic pain: Secondary | ICD-10-CM | POA: Diagnosis not present

## 2014-10-08 DIAGNOSIS — M47817 Spondylosis without myelopathy or radiculopathy, lumbosacral region: Secondary | ICD-10-CM | POA: Diagnosis not present

## 2014-10-08 DIAGNOSIS — M545 Low back pain: Secondary | ICD-10-CM | POA: Diagnosis not present

## 2014-10-22 DIAGNOSIS — R110 Nausea: Secondary | ICD-10-CM | POA: Diagnosis not present

## 2014-10-22 DIAGNOSIS — G8929 Other chronic pain: Secondary | ICD-10-CM | POA: Diagnosis not present

## 2014-10-22 DIAGNOSIS — Z681 Body mass index (BMI) 19 or less, adult: Secondary | ICD-10-CM | POA: Diagnosis not present

## 2014-11-07 DIAGNOSIS — Z720 Tobacco use: Secondary | ICD-10-CM | POA: Diagnosis not present

## 2014-11-07 DIAGNOSIS — Z5181 Encounter for therapeutic drug level monitoring: Secondary | ICD-10-CM | POA: Diagnosis not present

## 2014-11-07 DIAGNOSIS — E782 Mixed hyperlipidemia: Secondary | ICD-10-CM | POA: Diagnosis not present

## 2014-11-07 DIAGNOSIS — I10 Essential (primary) hypertension: Secondary | ICD-10-CM | POA: Diagnosis not present

## 2014-11-07 DIAGNOSIS — M5137 Other intervertebral disc degeneration, lumbosacral region: Secondary | ICD-10-CM | POA: Diagnosis not present

## 2014-11-07 DIAGNOSIS — Z716 Tobacco abuse counseling: Secondary | ICD-10-CM | POA: Diagnosis not present

## 2014-11-07 DIAGNOSIS — E1165 Type 2 diabetes mellitus with hyperglycemia: Secondary | ICD-10-CM | POA: Diagnosis not present

## 2014-11-07 DIAGNOSIS — J449 Chronic obstructive pulmonary disease, unspecified: Secondary | ICD-10-CM | POA: Diagnosis not present

## 2014-11-15 DIAGNOSIS — Z1231 Encounter for screening mammogram for malignant neoplasm of breast: Secondary | ICD-10-CM | POA: Diagnosis not present

## 2014-12-06 DIAGNOSIS — Z79891 Long term (current) use of opiate analgesic: Secondary | ICD-10-CM | POA: Diagnosis not present

## 2014-12-06 DIAGNOSIS — J438 Other emphysema: Secondary | ICD-10-CM | POA: Diagnosis not present

## 2014-12-06 DIAGNOSIS — M5137 Other intervertebral disc degeneration, lumbosacral region: Secondary | ICD-10-CM | POA: Diagnosis not present

## 2014-12-06 DIAGNOSIS — Z5181 Encounter for therapeutic drug level monitoring: Secondary | ICD-10-CM | POA: Diagnosis not present

## 2014-12-06 DIAGNOSIS — F11220 Opioid dependence with intoxication, uncomplicated: Secondary | ICD-10-CM | POA: Diagnosis not present

## 2014-12-06 DIAGNOSIS — E782 Mixed hyperlipidemia: Secondary | ICD-10-CM | POA: Diagnosis not present

## 2015-05-26 IMAGING — CR DG CHEST 2V
2 series · 2 of 2 positions shown · non-contrast
Comparison: 07/30/2013

CLINICAL DATA: COPD.

EXAM:
CHEST  2 VIEW

[w chest pa]
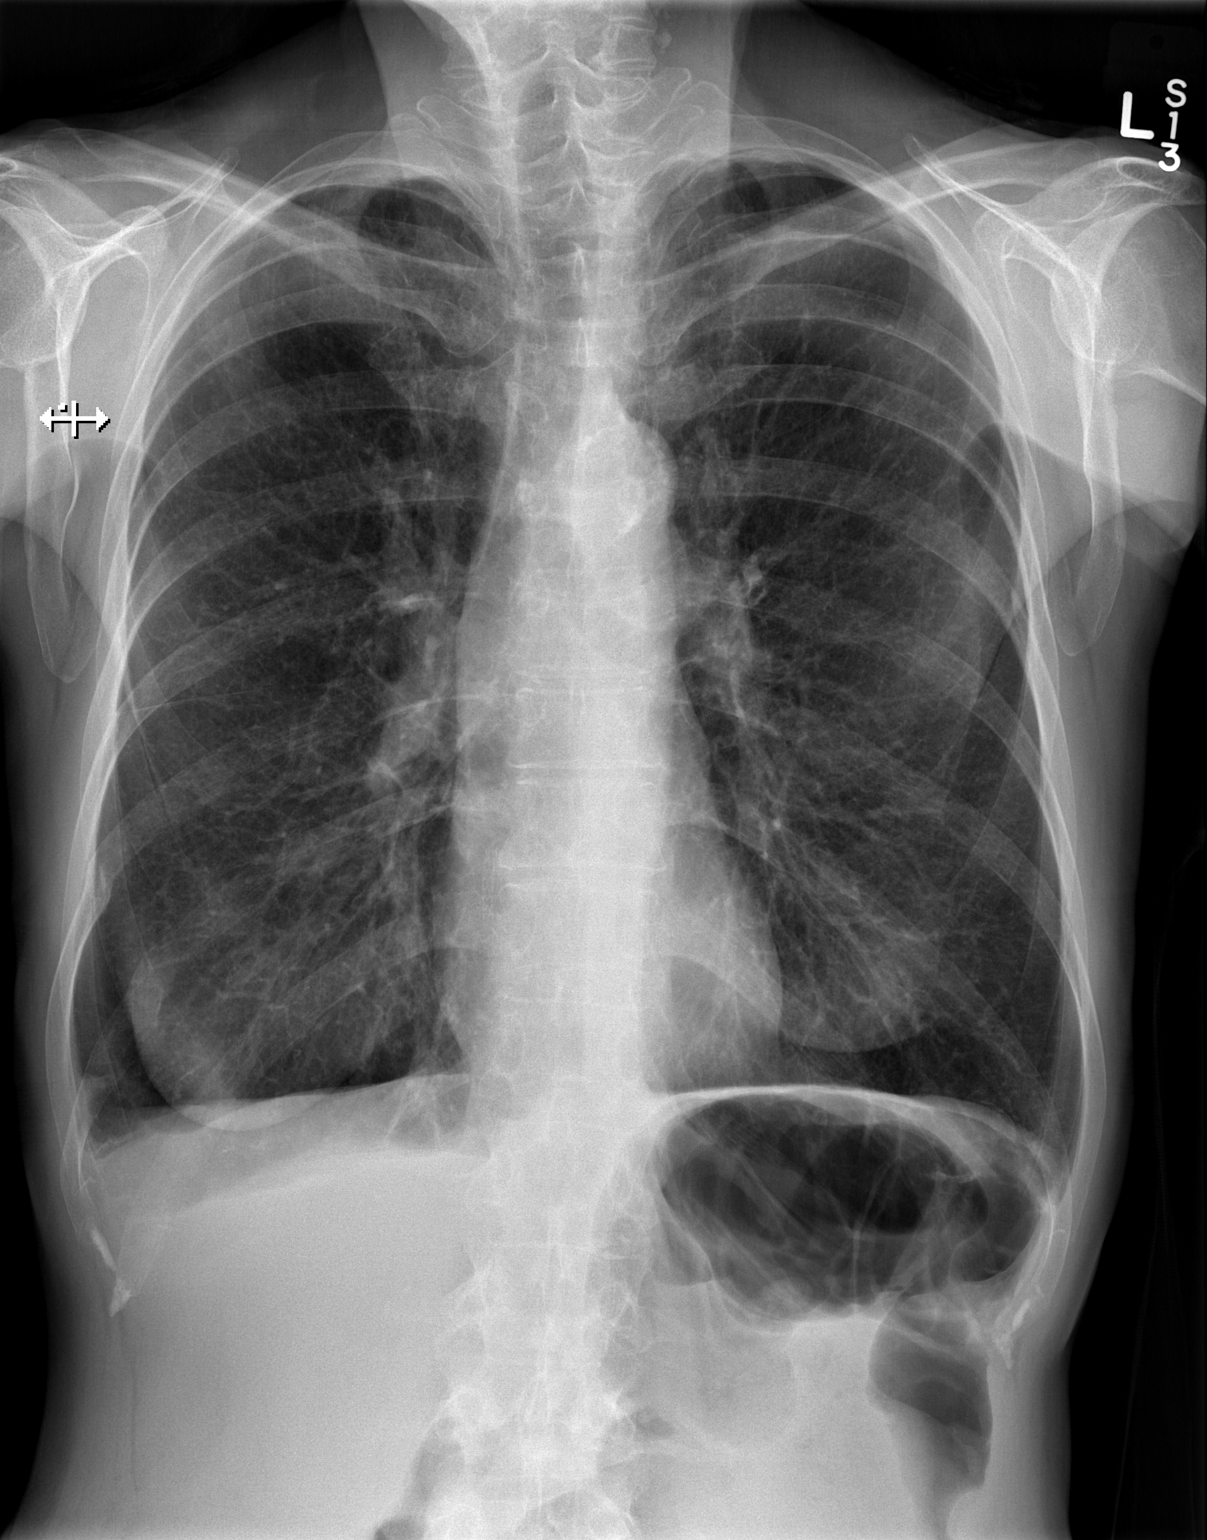

[w chest lat]
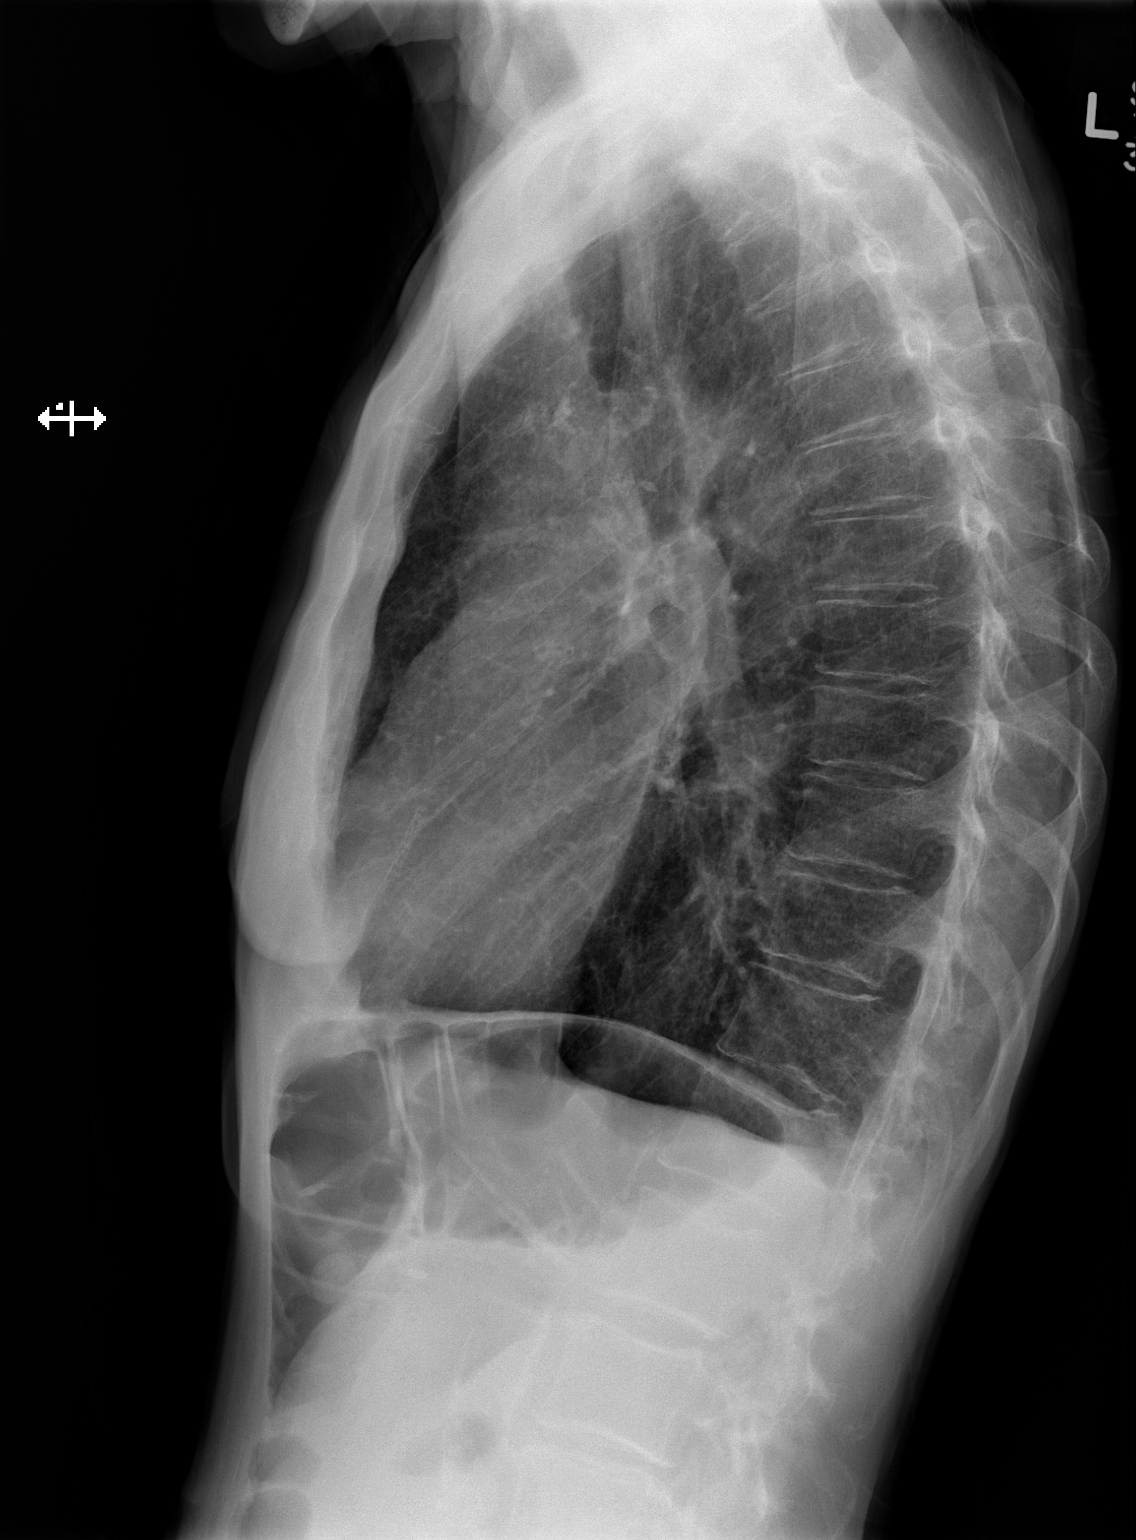

[2 of 2 positions shown; findings below may reference images not displayed]

FINDINGS: Cardiac silhouette is normal in size and configuration. Normal
mediastinal and hilar contours.

Lungs are hyperexpanded. Right lung base opacity seen on the prior
study has resolved. There is mild bilateral interstitial prominence.
No lung consolidation. No edema. No pleural effusion or pneumothorax
is seen.

Bony thorax is demineralized but intact.
IMPRESSION: 1. No acute cardiopulmonary disease.
2. COPD.

## 2017-01-18 DIAGNOSIS — J441 Chronic obstructive pulmonary disease with (acute) exacerbation: Secondary | ICD-10-CM

## 2017-01-18 DIAGNOSIS — R509 Fever, unspecified: Secondary | ICD-10-CM | POA: Diagnosis not present

## 2017-01-18 DIAGNOSIS — J180 Bronchopneumonia, unspecified organism: Secondary | ICD-10-CM

## 2017-01-18 DIAGNOSIS — R079 Chest pain, unspecified: Secondary | ICD-10-CM | POA: Diagnosis not present

## 2017-01-18 DIAGNOSIS — M545 Low back pain: Secondary | ICD-10-CM | POA: Diagnosis not present

## 2017-01-18 DIAGNOSIS — I2510 Atherosclerotic heart disease of native coronary artery without angina pectoris: Secondary | ICD-10-CM

## 2017-01-19 DIAGNOSIS — I2510 Atherosclerotic heart disease of native coronary artery without angina pectoris: Secondary | ICD-10-CM | POA: Diagnosis not present

## 2017-01-19 DIAGNOSIS — J180 Bronchopneumonia, unspecified organism: Secondary | ICD-10-CM | POA: Diagnosis not present

## 2017-01-19 DIAGNOSIS — M545 Low back pain: Secondary | ICD-10-CM | POA: Diagnosis not present

## 2017-01-19 DIAGNOSIS — R079 Chest pain, unspecified: Secondary | ICD-10-CM | POA: Diagnosis not present

## 2017-01-19 DIAGNOSIS — J441 Chronic obstructive pulmonary disease with (acute) exacerbation: Secondary | ICD-10-CM | POA: Diagnosis not present

## 2017-01-20 DIAGNOSIS — I2510 Atherosclerotic heart disease of native coronary artery without angina pectoris: Secondary | ICD-10-CM | POA: Diagnosis not present

## 2017-01-20 DIAGNOSIS — R079 Chest pain, unspecified: Secondary | ICD-10-CM | POA: Diagnosis not present

## 2017-01-20 DIAGNOSIS — J441 Chronic obstructive pulmonary disease with (acute) exacerbation: Secondary | ICD-10-CM | POA: Diagnosis not present

## 2017-01-20 DIAGNOSIS — J180 Bronchopneumonia, unspecified organism: Secondary | ICD-10-CM | POA: Diagnosis not present

## 2017-01-27 DIAGNOSIS — R002 Palpitations: Secondary | ICD-10-CM

## 2018-08-29 DIAGNOSIS — I2510 Atherosclerotic heart disease of native coronary artery without angina pectoris: Secondary | ICD-10-CM | POA: Diagnosis not present

## 2018-08-29 DIAGNOSIS — F19980 Other psychoactive substance use, unspecified with psychoactive substance-induced anxiety disorder: Secondary | ICD-10-CM | POA: Diagnosis not present

## 2018-08-29 DIAGNOSIS — J439 Emphysema, unspecified: Secondary | ICD-10-CM | POA: Diagnosis not present

## 2018-08-29 DIAGNOSIS — M1990 Unspecified osteoarthritis, unspecified site: Secondary | ICD-10-CM | POA: Diagnosis not present

## 2018-08-29 DIAGNOSIS — E7800 Pure hypercholesterolemia, unspecified: Secondary | ICD-10-CM | POA: Diagnosis not present

## 2018-08-29 DIAGNOSIS — F1920 Other psychoactive substance dependence, uncomplicated: Secondary | ICD-10-CM | POA: Diagnosis not present

## 2018-08-29 DIAGNOSIS — I219 Acute myocardial infarction, unspecified: Secondary | ICD-10-CM | POA: Diagnosis not present

## 2018-08-29 DIAGNOSIS — Z681 Body mass index (BMI) 19 or less, adult: Secondary | ICD-10-CM | POA: Diagnosis not present

## 2018-08-29 DIAGNOSIS — F1120 Opioid dependence, uncomplicated: Secondary | ICD-10-CM | POA: Diagnosis not present

## 2018-09-12 DIAGNOSIS — I2510 Atherosclerotic heart disease of native coronary artery without angina pectoris: Secondary | ICD-10-CM | POA: Diagnosis not present

## 2018-09-12 DIAGNOSIS — F19980 Other psychoactive substance use, unspecified with psychoactive substance-induced anxiety disorder: Secondary | ICD-10-CM | POA: Diagnosis not present

## 2018-09-12 DIAGNOSIS — Z681 Body mass index (BMI) 19 or less, adult: Secondary | ICD-10-CM | POA: Diagnosis not present

## 2018-09-12 DIAGNOSIS — M1990 Unspecified osteoarthritis, unspecified site: Secondary | ICD-10-CM | POA: Diagnosis not present

## 2018-09-12 DIAGNOSIS — J439 Emphysema, unspecified: Secondary | ICD-10-CM | POA: Diagnosis not present

## 2018-09-12 DIAGNOSIS — F1920 Other psychoactive substance dependence, uncomplicated: Secondary | ICD-10-CM | POA: Diagnosis not present

## 2018-09-12 DIAGNOSIS — F1120 Opioid dependence, uncomplicated: Secondary | ICD-10-CM | POA: Diagnosis not present

## 2018-09-12 DIAGNOSIS — I219 Acute myocardial infarction, unspecified: Secondary | ICD-10-CM | POA: Diagnosis not present

## 2018-09-12 DIAGNOSIS — E7800 Pure hypercholesterolemia, unspecified: Secondary | ICD-10-CM | POA: Diagnosis not present

## 2018-09-26 DIAGNOSIS — J439 Emphysema, unspecified: Secondary | ICD-10-CM | POA: Diagnosis not present

## 2018-09-26 DIAGNOSIS — I2510 Atherosclerotic heart disease of native coronary artery without angina pectoris: Secondary | ICD-10-CM | POA: Diagnosis not present

## 2018-09-26 DIAGNOSIS — E7800 Pure hypercholesterolemia, unspecified: Secondary | ICD-10-CM | POA: Diagnosis not present

## 2018-09-26 DIAGNOSIS — M1990 Unspecified osteoarthritis, unspecified site: Secondary | ICD-10-CM | POA: Diagnosis not present

## 2018-09-26 DIAGNOSIS — I219 Acute myocardial infarction, unspecified: Secondary | ICD-10-CM | POA: Diagnosis not present

## 2018-09-26 DIAGNOSIS — F1120 Opioid dependence, uncomplicated: Secondary | ICD-10-CM | POA: Diagnosis not present

## 2018-09-26 DIAGNOSIS — Z681 Body mass index (BMI) 19 or less, adult: Secondary | ICD-10-CM | POA: Diagnosis not present

## 2018-09-26 DIAGNOSIS — F1920 Other psychoactive substance dependence, uncomplicated: Secondary | ICD-10-CM | POA: Diagnosis not present

## 2018-09-26 DIAGNOSIS — F19980 Other psychoactive substance use, unspecified with psychoactive substance-induced anxiety disorder: Secondary | ICD-10-CM | POA: Diagnosis not present

## 2018-10-10 DIAGNOSIS — Z681 Body mass index (BMI) 19 or less, adult: Secondary | ICD-10-CM | POA: Diagnosis not present

## 2018-10-10 DIAGNOSIS — I219 Acute myocardial infarction, unspecified: Secondary | ICD-10-CM | POA: Diagnosis not present

## 2018-10-10 DIAGNOSIS — F1120 Opioid dependence, uncomplicated: Secondary | ICD-10-CM | POA: Diagnosis not present

## 2018-10-10 DIAGNOSIS — F19980 Other psychoactive substance use, unspecified with psychoactive substance-induced anxiety disorder: Secondary | ICD-10-CM | POA: Diagnosis not present

## 2018-10-10 DIAGNOSIS — J439 Emphysema, unspecified: Secondary | ICD-10-CM | POA: Diagnosis not present

## 2018-10-10 DIAGNOSIS — F1920 Other psychoactive substance dependence, uncomplicated: Secondary | ICD-10-CM | POA: Diagnosis not present

## 2018-10-10 DIAGNOSIS — I2510 Atherosclerotic heart disease of native coronary artery without angina pectoris: Secondary | ICD-10-CM | POA: Diagnosis not present

## 2018-10-10 DIAGNOSIS — M1990 Unspecified osteoarthritis, unspecified site: Secondary | ICD-10-CM | POA: Diagnosis not present

## 2018-10-10 DIAGNOSIS — E7800 Pure hypercholesterolemia, unspecified: Secondary | ICD-10-CM | POA: Diagnosis not present

## 2018-10-20 DIAGNOSIS — M5137 Other intervertebral disc degeneration, lumbosacral region: Secondary | ICD-10-CM | POA: Diagnosis not present

## 2018-10-20 DIAGNOSIS — Z95828 Presence of other vascular implants and grafts: Secondary | ICD-10-CM | POA: Diagnosis not present

## 2018-10-20 DIAGNOSIS — E782 Mixed hyperlipidemia: Secondary | ICD-10-CM | POA: Diagnosis not present

## 2018-10-20 DIAGNOSIS — R636 Underweight: Secondary | ICD-10-CM | POA: Diagnosis not present

## 2018-10-20 DIAGNOSIS — J449 Chronic obstructive pulmonary disease, unspecified: Secondary | ICD-10-CM | POA: Diagnosis not present

## 2018-10-20 DIAGNOSIS — Z9119 Patient's noncompliance with other medical treatment and regimen: Secondary | ICD-10-CM | POA: Diagnosis not present

## 2018-10-20 DIAGNOSIS — I10 Essential (primary) hypertension: Secondary | ICD-10-CM | POA: Diagnosis not present

## 2018-10-24 ENCOUNTER — Encounter: Payer: Self-pay | Admitting: Cardiology

## 2018-10-24 DIAGNOSIS — I2510 Atherosclerotic heart disease of native coronary artery without angina pectoris: Secondary | ICD-10-CM | POA: Diagnosis not present

## 2018-10-24 DIAGNOSIS — F1120 Opioid dependence, uncomplicated: Secondary | ICD-10-CM | POA: Diagnosis not present

## 2018-10-24 DIAGNOSIS — F19980 Other psychoactive substance use, unspecified with psychoactive substance-induced anxiety disorder: Secondary | ICD-10-CM | POA: Diagnosis not present

## 2018-10-24 DIAGNOSIS — R0602 Shortness of breath: Secondary | ICD-10-CM | POA: Insufficient documentation

## 2018-10-24 DIAGNOSIS — E7800 Pure hypercholesterolemia, unspecified: Secondary | ICD-10-CM | POA: Diagnosis not present

## 2018-10-24 DIAGNOSIS — M1990 Unspecified osteoarthritis, unspecified site: Secondary | ICD-10-CM | POA: Insufficient documentation

## 2018-10-24 DIAGNOSIS — I219 Acute myocardial infarction, unspecified: Secondary | ICD-10-CM | POA: Diagnosis not present

## 2018-10-24 DIAGNOSIS — Z720 Tobacco use: Secondary | ICD-10-CM | POA: Insufficient documentation

## 2018-10-24 DIAGNOSIS — G8929 Other chronic pain: Secondary | ICD-10-CM | POA: Insufficient documentation

## 2018-10-24 DIAGNOSIS — F1920 Other psychoactive substance dependence, uncomplicated: Secondary | ICD-10-CM | POA: Diagnosis not present

## 2018-10-24 DIAGNOSIS — Z681 Body mass index (BMI) 19 or less, adult: Secondary | ICD-10-CM | POA: Diagnosis not present

## 2018-10-24 DIAGNOSIS — M549 Dorsalgia, unspecified: Secondary | ICD-10-CM | POA: Insufficient documentation

## 2018-10-24 DIAGNOSIS — J439 Emphysema, unspecified: Secondary | ICD-10-CM | POA: Diagnosis not present

## 2018-10-24 DIAGNOSIS — E782 Mixed hyperlipidemia: Secondary | ICD-10-CM

## 2018-10-24 DIAGNOSIS — J449 Chronic obstructive pulmonary disease, unspecified: Secondary | ICD-10-CM | POA: Insufficient documentation

## 2018-10-25 ENCOUNTER — Other Ambulatory Visit: Payer: Self-pay

## 2018-10-25 ENCOUNTER — Encounter: Payer: Self-pay | Admitting: Cardiology

## 2018-10-25 ENCOUNTER — Ambulatory Visit (INDEPENDENT_AMBULATORY_CARE_PROVIDER_SITE_OTHER): Payer: Medicare Other | Admitting: Cardiology

## 2018-10-25 VITALS — BP 156/80 | HR 82 | Ht 64.0 in | Wt 111.0 lb

## 2018-10-25 DIAGNOSIS — Z955 Presence of coronary angioplasty implant and graft: Secondary | ICD-10-CM | POA: Diagnosis not present

## 2018-10-25 DIAGNOSIS — I209 Angina pectoris, unspecified: Secondary | ICD-10-CM | POA: Insufficient documentation

## 2018-10-25 DIAGNOSIS — J431 Panlobular emphysema: Secondary | ICD-10-CM

## 2018-10-25 DIAGNOSIS — R0989 Other specified symptoms and signs involving the circulatory and respiratory systems: Secondary | ICD-10-CM | POA: Diagnosis not present

## 2018-10-25 DIAGNOSIS — E782 Mixed hyperlipidemia: Secondary | ICD-10-CM

## 2018-10-25 DIAGNOSIS — Z87891 Personal history of nicotine dependence: Secondary | ICD-10-CM

## 2018-10-25 MED ORDER — NITROGLYCERIN 0.4 MG SL SUBL: 0.4000 mg | SUBLINGUAL_TABLET | SUBLINGUAL | 1 refills | Status: AC | PRN

## 2018-10-25 MED ORDER — NITROGLYCERIN 0.3 MG SL SUBL: 0.3000 mg | SUBLINGUAL_TABLET | SUBLINGUAL | 4 refills | Status: DC | PRN

## 2018-10-25 MED ORDER — METOPROLOL TARTRATE 50 MG PO TABS: ORAL_TABLET | ORAL | 0 refills | Status: AC

## 2018-10-25 NOTE — Addendum Note (Signed)
Addended by: Beckey Rutter on: 10/25/2018 03:17 PM   Modules accepted: Orders

## 2018-10-25 NOTE — Patient Instructions (Addendum)
Medication Instructions:  Your physician recommends that you continue on your current medications as directed. Please refer to the Current Medication list given to you today.  If you need a refill on your cardiac medications before your next appointment, please call your pharmacy.   Lab work: You will need to come in 7 days prior to procedure to have a BMP drawn.  If you have labs (blood work) drawn today and your tests are completely normal, you will receive your results only by: Marland Kitchen. MyChart Message (if you have MyChart) OR . A paper copy in the mail If you have any lab test that is abnormal or we need to change your treatment, we will call you to review the results.  Testing/Procedures: You had an EKG today  Your physician has requested that you have a carotid duplex. YOU HAVE BEEN SCHEDULED FOR Dec 14, 2018 at 2:15 pm This test is an ultrasound of the carotid arteries in your neck. It looks at blood flow through these arteries that supply the brain with blood. Allow one hour for this exam. There are no restrictions or special instructions.   Non-Cardiac CT scanning, (CAT scanning), is a noninvasive, special x-ray that produces cross-sectional images of the body using x-rays and a computer. CT scans help physicians diagnose and treat medical conditions. For some CT exams, a contrast material is used to enhance visibility in the area of the body being studied. CT scans provide greater clarity and reveal more details than regular x-ray exams.  [facility] at xx:xx AM (30-45 minutes prior to test start time)  [facility] [address] [place], [place] [address] [phone]  Proceed to the [facility] Radiology Department (First Floor).  Please follow these instructions carefully (unless otherwise directed):   On the Night Before the Test: . Be sure to Drink plenty of water. . Do not consume any  caffeinated/decaffeinated beverages or chocolate 12 hours prior to your test. . Do not take any antihistamines 12 hours prior to your test.   On the Day of the Test: . Drink plenty of water. Do not drink any water within one hour of the test. . Do not eat any food 4 hours prior to the test. . You may take your regular medications prior to the test.  . Take metoprolol (Lopressor) two hours prior to test if your HR is greater than 55.        After the Test: . Drink plenty of water. . After receiving IV contrast, you may experience a mild flushed feeling. This is normal. . On occasion, you may experience a mild rash up to 24 hours after the test. This is not dangerous. If this occurs, you can take Benadryl 25 mg and increase your fluid intake. . If you experience trouble breathing, this can be serious. If it is severe call 911 IMMEDIATELY. If it is mild, please call our office. . If you take any of these medications: Glipizide/Metformin, Avandament, Glucavance, please do not take 48 hours after completing test.   Follow-Up: At [facility], you and your health needs are our priority.  As part of our continuing mission to provide you with exceptional heart care, we have created designated Provider Care Teams.  These Care Teams include your primary Cardiologist (physician) and Advanced Practice Providers (APPs -  Physician Assistants and Nurse Practitioners) who all work together to provide you with the care you need, when  you need it. You will need a follow up appointment in 1 months.    Any Other Special Instructions Will Be Listed Below  Metoprolol tablets What is this medicine? METOPROLOL (me TOE proe lole) is a beta-blocker. Beta-blockers reduce the workload on the heart and help it to beat more regularly. This medicine is used to treat high blood pressure and to prevent chest pain. It is also used to after a heart attack and to prevent an additional heart attack from occurring. This  medicine may be used for other purposes; ask your health care provider or pharmacist if you have questions. COMMON BRAND NAME(S): Lopressor What should I tell my health care provider before I take this medicine? They need to know if you have any of these conditions: -diabetes -heart or vessel disease like slow heart rate, worsening heart failure, heart block, sick sinus syndrome or Raynaud's disease -kidney disease -liver disease -lung or breathing disease, like asthma or emphysema -pheochromocytoma -thyroid disease -an unusual or allergic reaction to metoprolol, other beta-blockers, medicines, foods, dyes, or preservatives -pregnant or trying to get pregnant -breast-feeding How should I use this medicine? Take this medicine by mouth with a drink of water. Follow the directions on the prescription label. Take this medicine immediately after meals. Take your doses at regular intervals. Do not take more medicine than directed. Do not stop taking this medicine suddenly. This could lead to serious heart-related effects. Talk to your pediatrician regarding the use of this medicine in children. Special care may be needed. Overdosage: If you think you have taken too much of this medicine contact a poison control center or emergency room at once. NOTE: This medicine is only for you. Do not share this medicine with others. What if I miss a dose? If you miss a dose, take it as soon as you can. If it is almost time for your next dose, take only that dose. Do not take double or extra doses. What may interact with this medicine? This medicine may interact with the following medications: -certain medicines for blood pressure, heart disease, irregular heart beat -certain medicines for depression like monoamine oxidase (MAO) inhibitors, fluoxetine, or paroxetine -clonidine -dobutamine -epinephrine -isoproterenol -reserpine This list may not describe all possible interactions. Give your health care  provider a list of all the medicines, herbs, non-prescription drugs, or dietary supplements you use. Also tell them if you smoke, drink alcohol, or use illegal drugs. Some items may interact with your medicine. What should I watch for while using this medicine? Visit your doctor or health care professional for regular check ups. Contact your doctor right away if your symptoms worsen. Check your blood pressure and pulse rate regularly. Ask your health care professional what your blood pressure and pulse rate should be, and when you should contact them. You may get drowsy or dizzy. Do not drive, use machinery, or do anything that needs mental alertness until you know how this medicine affects you. Do not sit or stand up quickly, especially if you are an older patient. This reduces the risk of dizzy or fainting spells. Contact your doctor if these symptoms continue. Alcohol may interfere with the effect of this medicine. Avoid alcoholic drinks. What side effects may I notice from receiving this medicine? Side effects that you should report to your doctor or health care professional as soon as possible: -allergic reactions like skin rash, itching or hives -cold or numb hands or feet -depression -difficulty breathing -faint -fever with sore throat -irregular heartbeat,  chest pain -rapid weight gain -swollen legs or ankles Side effects that usually do not require medical attention (report to your doctor or health care professional if they continue or are bothersome): -anxiety or nervousness -change in sex drive or performance -dry skin -headache -nightmares or trouble sleeping -short term memory loss -stomach upset or diarrhea -unusually tired This list may not describe all possible side effects. Call your doctor for medical advice about side effects. You may report side effects to FDA at 1-800-FDA-1088. Where should I keep my medicine? Keep out of the reach of children. Store at room  temperature between 15 and 30 degrees C (59 and 86 degrees F). Throw away any unused medicine after the expiration date. NOTE: This sheet is a summary. It may not cover all possible information. If you have questions about this medicine, talk to your doctor, pharmacist, or health care provider.  2019 Elsevier/Gold Standard (2012-12-15 14:40:36)   Coronary Angiogram A coronary angiogram is an X-ray procedure that is used to examine the arteries in the heart. In this procedure, a dye (contrast dye) is injected through a long, thin tube (catheter). The catheter is inserted through the groin, wrist, or arm. The dye is injected into each artery, then X-rays are taken to show if there is a blockage in the arteries of the heart. This procedure can also show if you have valve disease or a disease of the aorta, and it can be used to check the overall function of your heart muscle. You may have a coronary angiogram if:  You are having chest pain, or other symptoms of angina, and you are at risk for heart disease.  You have an abnormal electrocardiogram (ECG) or stress test.  You have chest pain and heart failure.  You are having irregular heart rhythms.  You and your health care provider determine that the benefits of the test information outweigh the risks of the procedure. Let your health care provider know about:  Any allergies you have, including allergies to contrast dye.  All medicines you are taking, including vitamins, herbs, eye drops, creams, and over-the-counter medicines.  Any problems you or family members have had with anesthetic medicines.  Any blood disorders you have.  Any surgeries you have had.  History of kidney problems or kidney failure.  Any medical conditions you have.  Whether you are pregnant or may be pregnant. What are the risks? Generally, this is a safe procedure. However, problems may occur, including:  Infection.  Allergic reaction to medicines or dyes  that are used.  Bleeding from the access site or other locations.  Kidney injury, especially in people with impaired kidney function.  Stroke (rare).  Heart attack (rare).  Damage to other structures or organs. What happens before the procedure? Staying hydrated Follow instructions from your health care provider about hydration, which may include:  Up to 2 hours before the procedure - you may continue to drink clear liquids, such as water, clear fruit juice, black coffee, and plain tea. Eating and drinking restrictions Follow instructions from your health care provider about eating and drinking, which may include:  8 hours before the procedure - stop eating heavy meals or foods such as meat, fried foods, or fatty foods.  6 hours before the procedure - stop eating light meals or foods, such as toast or cereal.  2 hours before the procedure - stop drinking clear liquids. General instructions  Ask your health care provider about: ? Changing or stopping your regular medicines.  This is especially important if you are taking diabetes medicines or blood thinners. ? Taking medicines such as ibuprofen. These medicines can thin your blood. Do not take these medicines before your procedure if your health care provider instructs you not to, though aspirin may be recommended prior to coronary angiograms.  Plan to have someone take you home from the hospital or clinic.  You may need to have blood tests or X-rays done. What happens during the procedure?  An IV tube will be inserted into one of your veins.  You will be given one or more of the following: ? A medicine to help you relax (sedative). ? A medicine to numb the area where the catheter will be inserted into an artery (local anesthetic).  To reduce your risk of infection: ? Your health care team will wash or sanitize their hands. ? Your skin will be washed with soap. ? Hair may be removed from the area where the catheter will be  inserted.  You will be connected to a continuous ECG monitor.  The catheter will be inserted into an artery. The location may be in your groin, in your wrist, or in the fold of your arm (near your elbow).  A type of X-ray (fluoroscopy) will be used to help guide the catheter to the opening of the blood vessel that is being examined.  A dye will be injected into the catheter, and X-rays will be taken. The dye will help to show where any narrowing or blockages are located in the heart arteries.  Tell your health care provider if you have any chest pain or trouble breathing during the procedure.  If blockages are found, your health care provider may perform another procedure, such as inserting a coronary stent. The procedure may vary among health care providers and hospitals. What happens after the procedure?  After the procedure, you will need to keep the area still for a few hours, or for as long as told by your health care provider. If the procedure is done through the groin, you will be instructed to not bend and not cross your legs.  The insertion site will be checked frequently.  The pulse in your foot or wrist will be checked frequently.  You may have additional blood tests, X-rays, and a test that records the electrical activity of your heart (ECG).  Do not drive for 24 hours if you were given a sedative. Summary  A coronary angiogram is an X-ray procedure that is used to look into the arteries in the heart.  During the procedure, a dye (contrast dye) is injected through a long, thin tube (catheter). The catheter is inserted through the groin, wrist, or arm.  Tell your health care provider about any allergies you have, including allergies to contrast dye.  After the procedure, you will need to keep the area still for a few hours, or for as long as told by your health care provider. This information is not intended to replace advice given to you by your health care provider.  Make sure you discuss any questions you have with your health care provider. Document Released: 10/17/2002 Document Revised: 01/23/2016 Document Reviewed: 01/23/2016 Elsevier Interactive Patient Education  2019 Elsevier Inc.   Vascular Ultrasound A vascular ultrasound is a painless test that is done to see if you have blood flow problems or clots in your blood vessels. It uses harmless sound waves to take pictures of the arteries and veins in your body. The pictures are  taken by passing a device (transducer) over certain areas of your body. Tell a health care provider about:  Any allergies you have.  All medicines you are taking, including vitamins, herbs, eye drops, creams, and over-the-counter medicines.  Any blood disorders you have.  Any surgeries you have had.  Any medical conditions you have.  Whether you are pregnant or may be pregnant. What are the risks? Generally, this is a safe procedure. There are no known risks or complications that arise from having an ultrasound. What happens before the procedure?  If the ultrasound scan involves your upper abdomen, you may be told not to eat or chew gum the morning of your exam. Follow your health care provider's instructions.  Do not smoke or use nicotine products at least 30 minutes before the exam.  During the test, a gel will be applied to your skin. What happens during the procedure?   A gel will be applied to your skin. It may feel cool.  The transducer will be placed on the area to be examined.  Pictures will be taken. They will be displayed on one or more monitors that look like small television screens. What happens after the procedure?  You can safely drive home immediately after your exam.  You may resume your normal diet and activities.  Keep all follow-up visits as told by your health care provider. This is important.  It is up to you to get your test results. Ask your health care provider, or the department  that is doing the test: ? When will my results be ready? ? How will I get my results? ? What are my treatment options? ? What other tests do I need? ? What are my next steps? Summary  A vascular ultrasound is a painless test that is done to see if you have blood flow problems or clots in your blood vessels. It uses harmless sound waves to take pictures of the arteries and veins in your body.  Generally, this is a safe procedure. There are no known risks or complications that arise from having an ultrasound.  A gel will be applied to your skin. It may feel cool. The device that takes the pictures (transducer) will then be placed on the area to be examined.  It is up to you to get your test results. Ask your health care provider or the department that is doing the test when your results will be ready and how you will get your results. This information is not intended to replace advice given to you by your health care provider. Make sure you discuss any questions you have with your health care provider. Document Released: 04/23/2004 Document Revised: 08/01/2018 Document Reviewed: 05/19/2017 Elsevier Patient Education  2020 [employer].

## 2018-10-25 NOTE — Addendum Note (Signed)
Addended by: Jerl Santos R on: 10/25/2018 03:44 PM   Modules accepted: Orders

## 2018-10-25 NOTE — Telephone Encounter (Signed)
Re sent Nitroglycerine as .04 instead of .03. Pharmacy does not have .03.

## 2018-10-25 NOTE — Progress Notes (Signed)
Cardiology Office Note:    Date:  10/25/2018   ID:  Chloe Mosley, DOB 09/23/1950, MRN [id number]  PCP:  [facility]  Cardiologist:  Garwin Brothersajan R , MD   Referring MD: Lucianne LeiUppin, Nina, MD    ASSESSMENT:    1. Angina pectoris (HCC)   2. H/O right coronary artery stent placement   3. Bilateral carotid bruits   4. Panlobular emphysema (HCC)   5. Mixed hyperlipidemia   6. History of smoking    PLAN:    In order of problems listed above:  1. Coronary artery disease/angina pectoris: Secondary prevention stressed with the patient.  Importance of compliance with diet and medication stressed and she vocalized understanding.  Various modalities invasive and noninvasive were discussed with the patient to assess her symptoms.  They have mixed features but in view of multiple risk factors this certainly need to be addressed and evaluated.  She finally agreed to CT coronary angiography and we will set this up for her.  Again she has right coronary artery stent and I mentioned to her the possible limitations of this testing. 2. Sublingual nitroglycerin prescription was sent, its protocol and 911 protocol explained and the patient vocalized understanding questions were answered to the patient's satisfaction 3. Essential hypertension: Blood pressure stable 4. Bilateral carotid bruits: We will get bilateral Dopplers to rule out coronary artery stenosis. 5. Mixed dyslipidemia: Lipids are followed by primary care physician. 6. History of smoking: I emphasized to her never to go back to smoking and she understands. 7. Patient will be seen in follow-up appointment in 1 month or earlier if the patient has any concerns    Medication Adjustments/Labs and Tests Ordered: Current medicines are reviewed at length with the patient today.  Concerns regarding medicines are outlined above.  No orders of the defined types were placed in this encounter.  No orders of the  defined types were placed in this encounter.    History of Present Illness:    Chloe BeamDoris S Gainer is a 68 y.o. female who is being seen today for the evaluation of coronary artery disease and chest tightness at the request of Lucianne LeiUppin, Nina, MD.  Patient has past medical history of coronary artery disease.  She has had coronary artery stent to the right coronary artery in 2003.  Subsequently she has had no such interventions.  She has history of essential hypertension dyslipidemia and is a ex-smoker quit about 5 years ago.  She has a history of COPD.  She mentions to me that when she exerts herself at times she has sharp pains in the chest.  They may or may not radiate to the shoulder.  At the time of my evaluation, the patient is alert awake oriented and in no distress.  No orthopnea or PND.  Past Medical History:  Diagnosis Date  . Arthritis   . Chronic back pain   . Constipation - functional   . COPD (chronic obstructive pulmonary disease) (HCC)   . Coronary artery disease   . Heart attack (HCC)   . Mixed hyperlipidemia   . Other intervertebral disc degeneration, lumbosacral region   . Shortness of breath   . Tobacco use     Past Surgical History:  Procedure Laterality Date  . ABDOMINAL HYSTERECTOMY    . APPENDECTOMY    . CARDIAC CATHETERIZATION    . DILATION AND CURETTAGE OF UTERUS     x 3  . hysterectomy    . LEG  SURGERY    . ovarain cysts     x 3  . stents place  2003  . TONSILLECTOMY    . VIDEO BRONCHOSCOPY WITH ENDOBRONCHIAL ULTRASOUND N/A 08/17/2013   Procedure: VIDEO BRONCHOSCOPY WITH ENDOBRONCHIAL ULTRASOUND;  Surgeon: Doree Fudge, MD;  Location: [place];  Service: Pulmonary;  Laterality: N/A;    Current Medications: Current Meds  Medication Sig  . albuterol (VENTOLIN HFA) 108 (90 BASE) MCG/ACT inhaler Inhale 2 puffs into the lungs every 4 (four) hours as needed for wheezing or shortness of breath.  Marland Kitchen atorvastatin (LIPITOR) 40 MG tablet Take 40 mg by mouth at  bedtime.  . Fluticasone-Umeclidin-Vilant (TRELEGY ELLIPTA) 100-62.5-25 MCG/INH AEPB Inhale into the lungs daily.  Marland Kitchen gabapentin (NEURONTIN) 800 MG tablet Take 800 mg by mouth 4 (four) times daily.  Marland Kitchen ipratropium-albuterol (DUONEB) 0.5-2.5 (3) MG/3ML SOLN Take 3 mLs by nebulization.  Marland Kitchen lisinopril (ZESTRIL) 20 MG tablet Take 20 mg by mouth daily.  . nitroGLYCERIN (NITROSTAT) 0.3 MG SL tablet Place 0.3 mg under the tongue every 5 (five) minutes as needed for chest pain.  . promethazine (PHENERGAN) 25 MG tablet Take 25 mg by mouth every 4 (four) hours as needed for nausea or vomiting.  Marland Kitchen tiZANidine (ZANAFLEX) 4 MG tablet Take 4 mg by mouth 3 (three) times daily.  Marland Kitchen triamcinolone cream (KENALOG) 0.5 % Apply 1 application topically 2 (two) times daily.     Allergies:   Chlorhexidine gluconate   Social History   Socioeconomic History  . Marital status: Married    Spouse name: Not on file  . Number of children: Not on file  . Years of education: Not on file  . Highest education level: Not on file  Occupational History  . Not on file  Social Needs  . Financial resource strain: Not on file  . Food insecurity    Worry: Not on file    Inability: Not on file  . Transportation needs    Medical: Not on file    Non-medical: Not on file  Tobacco Use  . Smoking status: Former Smoker    Packs/day: 1.50    Years: 40.00    Pack years: 60.00    Types: Cigarettes    Start date: 04/26/1965  . Smokeless tobacco: Never Used  Substance and Sexual Activity  . Alcohol use: No  . Drug use: No  . Sexual activity: Not on file  Lifestyle  . Physical activity    Days per week: Not on file    Minutes per session: Not on file  . Stress: Not on file  Relationships  . Social Herbalist on phone: Not on file    Gets together: Not on file    Attends religious service: Not on file    Active member of club or organization: Not on file    Attends meetings of clubs or organizations: Not on file     Relationship status: Not on file  Other Topics Concern  . Not on file  Social History Narrative  . Not on file     Family History: The patient's family history includes Lung cancer in her son; Osteoporosis in her maternal grandmother; Rheum arthritis in her paternal grandmother.  ROS:   Please see the history of present illness.    All other systems reviewed and are negative.  EKGs/Labs/Other Studies Reviewed:    The following studies were reviewed today: EKG done today reveals sinus rhythm and nonspecific ST-T changes   Recent  Labs: No results found for requested labs within last 8760 hours.  Recent Lipid Panel No results found for: CHOL, TRIG, HDL, CHOLHDL, VLDL, LDLCALC, LDLDIRECT  Physical Exam:    VS:  BP (!) 156/80 (BP Location: Left Arm, Patient Position: Sitting, Cuff Size: Normal)   Pulse 82   Ht 5\' 4"  (1.626 m)   Wt 111 lb (50.3 kg)   SpO2 94%   BMI 19.05 kg/m     Wt Readings from Last 3 Encounters:  10/25/18 111 lb (50.3 kg)  10/20/18 110 lb (49.9 kg)  10/09/13 88 lb 3.2 oz (40 kg)     GEN: Patient is in no acute distress HEENT: Normal NECK: No JVD; No carotid bruits LYMPHATICS: No lymphadenopathy CARDIAC: S1 S2 regular, 2/6 systolic murmur at the apex. RESPIRATORY:  Clear to auscultation without rales, wheezing or rhonchi  ABDOMEN: Soft, non-tender, non-distended MUSCULOSKELETAL:  No edema; No deformity  SKIN: Warm and dry NEUROLOGIC:  Alert and oriented x 3 PSYCHIATRIC:  Normal affect    Signed, Garwin Brothers, MD  10/25/2018 2:48 PM    [facility]

## 2018-11-18 LAB — BASIC METABOLIC PANEL
BUN/Creatinine Ratio: 14 (ref 12–28)
BUN: 10 mg/dL (ref 8–27)
CO2: 18 mmol/L — ABNORMAL LOW (ref 20–29)
Calcium: 8.9 mg/dL (ref 8.7–10.3)
Chloride: 98 mmol/L (ref 96–106)
Creatinine, Ser: 0.73 mg/dL (ref 0.57–1.00)
GFR calc Af Amer: 98 mL/min/{1.73_m2} (ref >59)
GFR calc non Af Amer: 85 mL/min/{1.73_m2} (ref >59)
Glucose: 88 mg/dL (ref 65–99)
Potassium: 4.8 mmol/L (ref 3.5–5.2)
Sodium: 132 mmol/L — ABNORMAL LOW (ref 134–144)

## 2018-11-21 ENCOUNTER — Telehealth (HOSPITAL_COMMUNITY): Payer: Self-pay | Admitting: Emergency Medicine

## 2018-11-21 NOTE — Telephone Encounter (Signed)
Other household member answered phone, said patient was not home and to call back later  [facility] [phone] Office  [phone] Cell

## 2018-11-22 ENCOUNTER — Ambulatory Visit (HOSPITAL_COMMUNITY): Admission: RE | Admit: 2018-11-22 | Payer: Medicare Other | Source: Ambulatory Visit

## 2018-11-22 ENCOUNTER — Ambulatory Visit (HOSPITAL_COMMUNITY): Payer: Medicare Other | Attending: Cardiology

## 2018-11-23 ENCOUNTER — Telehealth: Payer: Self-pay

## 2018-11-23 NOTE — Telephone Encounter (Signed)
Left message for patient to call back for results, copy sent to [place].

## 2018-11-23 NOTE — Telephone Encounter (Signed)
-----   Message from Jenean Lindau, MD sent at 11/21/2018  9:53 AM EDT ----- The results of the study is unremarkable. Please inform patient. I will discuss in detail at next appointment. Cc  primary care/referring physician Jenean Lindau, MD 11/21/2018 9:53 AM

## 2018-11-27 ENCOUNTER — Telehealth: Payer: Self-pay

## 2018-11-27 NOTE — Telephone Encounter (Signed)
Called patient cell, unable to leave vm.

## 2018-11-27 NOTE — Telephone Encounter (Signed)
Phoned home and mobile numbers to give lab results. No answer/no voicemail on home numbers and no answer on cell phone.

## 2018-12-13 ENCOUNTER — Encounter: Payer: Self-pay | Admitting: Cardiology

## 2018-12-15 ENCOUNTER — Ambulatory Visit: Payer: Medicare Other | Admitting: Cardiology

## 2019-01-11 ENCOUNTER — Ambulatory Visit (HOSPITAL_COMMUNITY): Payer: Medicare Other

## 2019-05-08 DIAGNOSIS — F1120 Opioid dependence, uncomplicated: Secondary | ICD-10-CM | POA: Diagnosis not present

## 2019-05-08 DIAGNOSIS — Z681 Body mass index (BMI) 19 or less, adult: Secondary | ICD-10-CM | POA: Diagnosis not present

## 2019-05-08 DIAGNOSIS — I219 Acute myocardial infarction, unspecified: Secondary | ICD-10-CM | POA: Diagnosis not present

## 2019-05-08 DIAGNOSIS — I10 Essential (primary) hypertension: Secondary | ICD-10-CM | POA: Diagnosis not present

## 2019-05-08 DIAGNOSIS — M5137 Other intervertebral disc degeneration, lumbosacral region: Secondary | ICD-10-CM | POA: Diagnosis not present

## 2019-05-08 DIAGNOSIS — E782 Mixed hyperlipidemia: Secondary | ICD-10-CM | POA: Diagnosis not present

## 2019-05-08 DIAGNOSIS — F1920 Other psychoactive substance dependence, uncomplicated: Secondary | ICD-10-CM | POA: Diagnosis not present

## 2019-05-08 DIAGNOSIS — Z6821 Body mass index (BMI) 21.0-21.9, adult: Secondary | ICD-10-CM | POA: Diagnosis not present

## 2019-05-08 DIAGNOSIS — M1990 Unspecified osteoarthritis, unspecified site: Secondary | ICD-10-CM | POA: Diagnosis not present

## 2019-05-08 DIAGNOSIS — I2510 Atherosclerotic heart disease of native coronary artery without angina pectoris: Secondary | ICD-10-CM | POA: Diagnosis not present

## 2019-05-08 DIAGNOSIS — E7800 Pure hypercholesterolemia, unspecified: Secondary | ICD-10-CM | POA: Diagnosis not present

## 2019-05-08 DIAGNOSIS — J439 Emphysema, unspecified: Secondary | ICD-10-CM | POA: Diagnosis not present

## 2019-05-08 DIAGNOSIS — E559 Vitamin D deficiency, unspecified: Secondary | ICD-10-CM | POA: Diagnosis not present

## 2019-05-08 DIAGNOSIS — F19980 Other psychoactive substance use, unspecified with psychoactive substance-induced anxiety disorder: Secondary | ICD-10-CM | POA: Diagnosis not present

## 2019-05-22 DIAGNOSIS — E7800 Pure hypercholesterolemia, unspecified: Secondary | ICD-10-CM | POA: Diagnosis not present

## 2019-05-22 DIAGNOSIS — M1990 Unspecified osteoarthritis, unspecified site: Secondary | ICD-10-CM | POA: Diagnosis not present

## 2019-05-22 DIAGNOSIS — F1920 Other psychoactive substance dependence, uncomplicated: Secondary | ICD-10-CM | POA: Diagnosis not present

## 2019-05-22 DIAGNOSIS — I129 Hypertensive chronic kidney disease with stage 1 through stage 4 chronic kidney disease, or unspecified chronic kidney disease: Secondary | ICD-10-CM | POA: Diagnosis not present

## 2019-05-22 DIAGNOSIS — F19980 Other psychoactive substance use, unspecified with psychoactive substance-induced anxiety disorder: Secondary | ICD-10-CM | POA: Diagnosis not present

## 2019-05-22 DIAGNOSIS — J439 Emphysema, unspecified: Secondary | ICD-10-CM | POA: Diagnosis not present

## 2019-05-22 DIAGNOSIS — I2510 Atherosclerotic heart disease of native coronary artery without angina pectoris: Secondary | ICD-10-CM | POA: Diagnosis not present

## 2019-05-22 DIAGNOSIS — F1120 Opioid dependence, uncomplicated: Secondary | ICD-10-CM | POA: Diagnosis not present

## 2019-05-22 DIAGNOSIS — Z681 Body mass index (BMI) 19 or less, adult: Secondary | ICD-10-CM | POA: Diagnosis not present

## 2019-06-05 DIAGNOSIS — E782 Mixed hyperlipidemia: Secondary | ICD-10-CM | POA: Diagnosis not present

## 2019-06-05 DIAGNOSIS — M1990 Unspecified osteoarthritis, unspecified site: Secondary | ICD-10-CM | POA: Diagnosis not present

## 2019-06-05 DIAGNOSIS — M5137 Other intervertebral disc degeneration, lumbosacral region: Secondary | ICD-10-CM | POA: Diagnosis not present

## 2019-06-05 DIAGNOSIS — I2510 Atherosclerotic heart disease of native coronary artery without angina pectoris: Secondary | ICD-10-CM | POA: Diagnosis not present

## 2019-06-05 DIAGNOSIS — Z681 Body mass index (BMI) 19 or less, adult: Secondary | ICD-10-CM | POA: Diagnosis not present

## 2019-06-05 DIAGNOSIS — F19980 Other psychoactive substance use, unspecified with psychoactive substance-induced anxiety disorder: Secondary | ICD-10-CM | POA: Diagnosis not present

## 2019-06-05 DIAGNOSIS — I10 Essential (primary) hypertension: Secondary | ICD-10-CM | POA: Diagnosis not present

## 2019-06-05 DIAGNOSIS — F1120 Opioid dependence, uncomplicated: Secondary | ICD-10-CM | POA: Diagnosis not present

## 2019-06-05 DIAGNOSIS — R5383 Other fatigue: Secondary | ICD-10-CM | POA: Diagnosis not present

## 2019-06-05 DIAGNOSIS — F1920 Other psychoactive substance dependence, uncomplicated: Secondary | ICD-10-CM | POA: Diagnosis not present

## 2019-06-05 DIAGNOSIS — E7800 Pure hypercholesterolemia, unspecified: Secondary | ICD-10-CM | POA: Diagnosis not present

## 2019-06-05 DIAGNOSIS — I219 Acute myocardial infarction, unspecified: Secondary | ICD-10-CM | POA: Diagnosis not present

## 2019-06-05 DIAGNOSIS — J439 Emphysema, unspecified: Secondary | ICD-10-CM | POA: Diagnosis not present

## 2019-06-19 DIAGNOSIS — J439 Emphysema, unspecified: Secondary | ICD-10-CM | POA: Diagnosis not present

## 2019-06-19 DIAGNOSIS — F1120 Opioid dependence, uncomplicated: Secondary | ICD-10-CM | POA: Diagnosis not present

## 2019-06-19 DIAGNOSIS — F19980 Other psychoactive substance use, unspecified with psychoactive substance-induced anxiety disorder: Secondary | ICD-10-CM | POA: Diagnosis not present

## 2019-06-19 DIAGNOSIS — I2510 Atherosclerotic heart disease of native coronary artery without angina pectoris: Secondary | ICD-10-CM | POA: Diagnosis not present

## 2019-06-19 DIAGNOSIS — E7800 Pure hypercholesterolemia, unspecified: Secondary | ICD-10-CM | POA: Diagnosis not present

## 2019-06-19 DIAGNOSIS — M1990 Unspecified osteoarthritis, unspecified site: Secondary | ICD-10-CM | POA: Diagnosis not present

## 2019-06-19 DIAGNOSIS — Z681 Body mass index (BMI) 19 or less, adult: Secondary | ICD-10-CM | POA: Diagnosis not present

## 2019-06-19 DIAGNOSIS — F1920 Other psychoactive substance dependence, uncomplicated: Secondary | ICD-10-CM | POA: Diagnosis not present

## 2019-06-19 DIAGNOSIS — I219 Acute myocardial infarction, unspecified: Secondary | ICD-10-CM | POA: Diagnosis not present

## 2019-06-28 DIAGNOSIS — M7989 Other specified soft tissue disorders: Secondary | ICD-10-CM | POA: Diagnosis not present

## 2019-06-28 DIAGNOSIS — R2242 Localized swelling, mass and lump, left lower limb: Secondary | ICD-10-CM | POA: Diagnosis not present

## 2019-06-28 DIAGNOSIS — M79605 Pain in left leg: Secondary | ICD-10-CM | POA: Diagnosis not present

## 2019-06-28 DIAGNOSIS — L54 Erythema in diseases classified elsewhere: Secondary | ICD-10-CM | POA: Diagnosis not present

## 2019-06-28 DIAGNOSIS — I10 Essential (primary) hypertension: Secondary | ICD-10-CM | POA: Diagnosis not present

## 2019-06-28 DIAGNOSIS — M79662 Pain in left lower leg: Secondary | ICD-10-CM | POA: Diagnosis not present

## 2019-06-28 DIAGNOSIS — L539 Erythematous condition, unspecified: Secondary | ICD-10-CM | POA: Diagnosis not present

## 2019-06-28 DIAGNOSIS — M79602 Pain in left arm: Secondary | ICD-10-CM | POA: Diagnosis not present

## 2019-07-03 DIAGNOSIS — I219 Acute myocardial infarction, unspecified: Secondary | ICD-10-CM | POA: Diagnosis not present

## 2019-07-03 DIAGNOSIS — F1920 Other psychoactive substance dependence, uncomplicated: Secondary | ICD-10-CM | POA: Diagnosis not present

## 2019-07-03 DIAGNOSIS — F19980 Other psychoactive substance use, unspecified with psychoactive substance-induced anxiety disorder: Secondary | ICD-10-CM | POA: Diagnosis not present

## 2019-07-03 DIAGNOSIS — I2510 Atherosclerotic heart disease of native coronary artery without angina pectoris: Secondary | ICD-10-CM | POA: Diagnosis not present

## 2019-07-03 DIAGNOSIS — F1120 Opioid dependence, uncomplicated: Secondary | ICD-10-CM | POA: Diagnosis not present

## 2019-07-03 DIAGNOSIS — E7800 Pure hypercholesterolemia, unspecified: Secondary | ICD-10-CM | POA: Diagnosis not present

## 2019-07-03 DIAGNOSIS — J439 Emphysema, unspecified: Secondary | ICD-10-CM | POA: Diagnosis not present

## 2019-07-03 DIAGNOSIS — Z681 Body mass index (BMI) 19 or less, adult: Secondary | ICD-10-CM | POA: Diagnosis not present

## 2019-07-03 DIAGNOSIS — M1990 Unspecified osteoarthritis, unspecified site: Secondary | ICD-10-CM | POA: Diagnosis not present

## 2019-07-17 DIAGNOSIS — Z681 Body mass index (BMI) 19 or less, adult: Secondary | ICD-10-CM | POA: Diagnosis not present

## 2019-07-17 DIAGNOSIS — I2510 Atherosclerotic heart disease of native coronary artery without angina pectoris: Secondary | ICD-10-CM | POA: Diagnosis not present

## 2019-07-17 DIAGNOSIS — J439 Emphysema, unspecified: Secondary | ICD-10-CM | POA: Diagnosis not present

## 2019-07-17 DIAGNOSIS — F1920 Other psychoactive substance dependence, uncomplicated: Secondary | ICD-10-CM | POA: Diagnosis not present

## 2019-07-17 DIAGNOSIS — F19980 Other psychoactive substance use, unspecified with psychoactive substance-induced anxiety disorder: Secondary | ICD-10-CM | POA: Diagnosis not present

## 2019-07-17 DIAGNOSIS — F1120 Opioid dependence, uncomplicated: Secondary | ICD-10-CM | POA: Diagnosis not present

## 2019-07-17 DIAGNOSIS — E7800 Pure hypercholesterolemia, unspecified: Secondary | ICD-10-CM | POA: Diagnosis not present

## 2019-07-17 DIAGNOSIS — I219 Acute myocardial infarction, unspecified: Secondary | ICD-10-CM | POA: Diagnosis not present

## 2019-07-17 DIAGNOSIS — M1990 Unspecified osteoarthritis, unspecified site: Secondary | ICD-10-CM | POA: Diagnosis not present

## 2019-07-31 DIAGNOSIS — I219 Acute myocardial infarction, unspecified: Secondary | ICD-10-CM | POA: Diagnosis not present

## 2019-07-31 DIAGNOSIS — F1920 Other psychoactive substance dependence, uncomplicated: Secondary | ICD-10-CM | POA: Diagnosis not present

## 2019-07-31 DIAGNOSIS — Z681 Body mass index (BMI) 19 or less, adult: Secondary | ICD-10-CM | POA: Diagnosis not present

## 2019-07-31 DIAGNOSIS — E7800 Pure hypercholesterolemia, unspecified: Secondary | ICD-10-CM | POA: Diagnosis not present

## 2019-07-31 DIAGNOSIS — I2510 Atherosclerotic heart disease of native coronary artery without angina pectoris: Secondary | ICD-10-CM | POA: Diagnosis not present

## 2019-07-31 DIAGNOSIS — F19980 Other psychoactive substance use, unspecified with psychoactive substance-induced anxiety disorder: Secondary | ICD-10-CM | POA: Diagnosis not present

## 2019-07-31 DIAGNOSIS — F1120 Opioid dependence, uncomplicated: Secondary | ICD-10-CM | POA: Diagnosis not present

## 2019-07-31 DIAGNOSIS — M1990 Unspecified osteoarthritis, unspecified site: Secondary | ICD-10-CM | POA: Diagnosis not present

## 2019-07-31 DIAGNOSIS — J439 Emphysema, unspecified: Secondary | ICD-10-CM | POA: Diagnosis not present

## 2019-08-14 DIAGNOSIS — I2510 Atherosclerotic heart disease of native coronary artery without angina pectoris: Secondary | ICD-10-CM | POA: Diagnosis not present

## 2019-08-14 DIAGNOSIS — F1120 Opioid dependence, uncomplicated: Secondary | ICD-10-CM | POA: Diagnosis not present

## 2019-08-14 DIAGNOSIS — M1990 Unspecified osteoarthritis, unspecified site: Secondary | ICD-10-CM | POA: Diagnosis not present

## 2019-08-14 DIAGNOSIS — F19980 Other psychoactive substance use, unspecified with psychoactive substance-induced anxiety disorder: Secondary | ICD-10-CM | POA: Diagnosis not present

## 2019-08-14 DIAGNOSIS — F1920 Other psychoactive substance dependence, uncomplicated: Secondary | ICD-10-CM | POA: Diagnosis not present

## 2019-08-14 DIAGNOSIS — Z681 Body mass index (BMI) 19 or less, adult: Secondary | ICD-10-CM | POA: Diagnosis not present

## 2019-08-14 DIAGNOSIS — E7800 Pure hypercholesterolemia, unspecified: Secondary | ICD-10-CM | POA: Diagnosis not present

## 2019-08-14 DIAGNOSIS — J439 Emphysema, unspecified: Secondary | ICD-10-CM | POA: Diagnosis not present

## 2019-08-14 DIAGNOSIS — I219 Acute myocardial infarction, unspecified: Secondary | ICD-10-CM | POA: Diagnosis not present

## 2019-08-22 DIAGNOSIS — H25813 Combined forms of age-related cataract, bilateral: Secondary | ICD-10-CM | POA: Diagnosis not present

## 2019-08-22 DIAGNOSIS — H40003 Preglaucoma, unspecified, bilateral: Secondary | ICD-10-CM | POA: Diagnosis not present

## 2019-08-22 DIAGNOSIS — H53021 Refractive amblyopia, right eye: Secondary | ICD-10-CM | POA: Diagnosis not present

## 2019-08-28 DIAGNOSIS — F1120 Opioid dependence, uncomplicated: Secondary | ICD-10-CM | POA: Diagnosis not present

## 2019-08-28 DIAGNOSIS — M1990 Unspecified osteoarthritis, unspecified site: Secondary | ICD-10-CM | POA: Diagnosis not present

## 2019-08-28 DIAGNOSIS — F1920 Other psychoactive substance dependence, uncomplicated: Secondary | ICD-10-CM | POA: Diagnosis not present

## 2019-08-28 DIAGNOSIS — I219 Acute myocardial infarction, unspecified: Secondary | ICD-10-CM | POA: Diagnosis not present

## 2019-08-28 DIAGNOSIS — I2510 Atherosclerotic heart disease of native coronary artery without angina pectoris: Secondary | ICD-10-CM | POA: Diagnosis not present

## 2019-08-28 DIAGNOSIS — Z681 Body mass index (BMI) 19 or less, adult: Secondary | ICD-10-CM | POA: Diagnosis not present

## 2019-08-28 DIAGNOSIS — E7800 Pure hypercholesterolemia, unspecified: Secondary | ICD-10-CM | POA: Diagnosis not present

## 2019-08-28 DIAGNOSIS — J439 Emphysema, unspecified: Secondary | ICD-10-CM | POA: Diagnosis not present

## 2019-08-28 DIAGNOSIS — F19980 Other psychoactive substance use, unspecified with psychoactive substance-induced anxiety disorder: Secondary | ICD-10-CM | POA: Diagnosis not present

## 2019-08-29 DIAGNOSIS — L89152 Pressure ulcer of sacral region, stage 2: Secondary | ICD-10-CM | POA: Diagnosis not present

## 2019-08-29 DIAGNOSIS — I10 Essential (primary) hypertension: Secondary | ICD-10-CM | POA: Diagnosis not present

## 2019-08-29 DIAGNOSIS — L54 Erythema in diseases classified elsewhere: Secondary | ICD-10-CM | POA: Diagnosis not present

## 2019-08-29 DIAGNOSIS — M545 Low back pain: Secondary | ICD-10-CM | POA: Diagnosis not present

## 2019-09-11 DIAGNOSIS — F19980 Other psychoactive substance use, unspecified with psychoactive substance-induced anxiety disorder: Secondary | ICD-10-CM | POA: Diagnosis not present

## 2019-09-11 DIAGNOSIS — F1120 Opioid dependence, uncomplicated: Secondary | ICD-10-CM | POA: Diagnosis not present

## 2019-09-11 DIAGNOSIS — F1920 Other psychoactive substance dependence, uncomplicated: Secondary | ICD-10-CM | POA: Diagnosis not present

## 2019-09-11 DIAGNOSIS — I2510 Atherosclerotic heart disease of native coronary artery without angina pectoris: Secondary | ICD-10-CM | POA: Diagnosis not present

## 2019-09-11 DIAGNOSIS — E7800 Pure hypercholesterolemia, unspecified: Secondary | ICD-10-CM | POA: Diagnosis not present

## 2019-09-11 DIAGNOSIS — Z681 Body mass index (BMI) 19 or less, adult: Secondary | ICD-10-CM | POA: Diagnosis not present

## 2019-09-25 DIAGNOSIS — I219 Acute myocardial infarction, unspecified: Secondary | ICD-10-CM | POA: Diagnosis not present

## 2019-09-25 DIAGNOSIS — M1990 Unspecified osteoarthritis, unspecified site: Secondary | ICD-10-CM | POA: Diagnosis not present

## 2019-09-25 DIAGNOSIS — Z6821 Body mass index (BMI) 21.0-21.9, adult: Secondary | ICD-10-CM | POA: Diagnosis not present

## 2019-09-25 DIAGNOSIS — F1920 Other psychoactive substance dependence, uncomplicated: Secondary | ICD-10-CM | POA: Diagnosis not present

## 2019-09-25 DIAGNOSIS — F19980 Other psychoactive substance use, unspecified with psychoactive substance-induced anxiety disorder: Secondary | ICD-10-CM | POA: Diagnosis not present

## 2019-09-25 DIAGNOSIS — E7800 Pure hypercholesterolemia, unspecified: Secondary | ICD-10-CM | POA: Diagnosis not present

## 2019-09-25 DIAGNOSIS — F1120 Opioid dependence, uncomplicated: Secondary | ICD-10-CM | POA: Diagnosis not present

## 2019-09-25 DIAGNOSIS — J439 Emphysema, unspecified: Secondary | ICD-10-CM | POA: Diagnosis not present

## 2019-09-25 DIAGNOSIS — I2510 Atherosclerotic heart disease of native coronary artery without angina pectoris: Secondary | ICD-10-CM | POA: Diagnosis not present

## 2019-10-09 DIAGNOSIS — F19980 Other psychoactive substance use, unspecified with psychoactive substance-induced anxiety disorder: Secondary | ICD-10-CM | POA: Diagnosis not present

## 2019-10-09 DIAGNOSIS — E7800 Pure hypercholesterolemia, unspecified: Secondary | ICD-10-CM | POA: Diagnosis not present

## 2019-10-09 DIAGNOSIS — Z6821 Body mass index (BMI) 21.0-21.9, adult: Secondary | ICD-10-CM | POA: Diagnosis not present

## 2019-10-09 DIAGNOSIS — I2510 Atherosclerotic heart disease of native coronary artery without angina pectoris: Secondary | ICD-10-CM | POA: Diagnosis not present

## 2019-10-09 DIAGNOSIS — J439 Emphysema, unspecified: Secondary | ICD-10-CM | POA: Diagnosis not present

## 2019-10-09 DIAGNOSIS — I219 Acute myocardial infarction, unspecified: Secondary | ICD-10-CM | POA: Diagnosis not present

## 2019-10-09 DIAGNOSIS — F1920 Other psychoactive substance dependence, uncomplicated: Secondary | ICD-10-CM | POA: Diagnosis not present

## 2019-10-09 DIAGNOSIS — F1120 Opioid dependence, uncomplicated: Secondary | ICD-10-CM | POA: Diagnosis not present

## 2019-10-09 DIAGNOSIS — M1990 Unspecified osteoarthritis, unspecified site: Secondary | ICD-10-CM | POA: Diagnosis not present

## 2019-10-23 DIAGNOSIS — F1920 Other psychoactive substance dependence, uncomplicated: Secondary | ICD-10-CM | POA: Diagnosis not present

## 2019-10-23 DIAGNOSIS — I219 Acute myocardial infarction, unspecified: Secondary | ICD-10-CM | POA: Diagnosis not present

## 2019-10-23 DIAGNOSIS — J439 Emphysema, unspecified: Secondary | ICD-10-CM | POA: Diagnosis not present

## 2019-10-23 DIAGNOSIS — Z6821 Body mass index (BMI) 21.0-21.9, adult: Secondary | ICD-10-CM | POA: Diagnosis not present

## 2019-10-23 DIAGNOSIS — F19980 Other psychoactive substance use, unspecified with psychoactive substance-induced anxiety disorder: Secondary | ICD-10-CM | POA: Diagnosis not present

## 2019-10-23 DIAGNOSIS — E7800 Pure hypercholesterolemia, unspecified: Secondary | ICD-10-CM | POA: Diagnosis not present

## 2019-10-23 DIAGNOSIS — I2510 Atherosclerotic heart disease of native coronary artery without angina pectoris: Secondary | ICD-10-CM | POA: Diagnosis not present

## 2019-10-23 DIAGNOSIS — M1990 Unspecified osteoarthritis, unspecified site: Secondary | ICD-10-CM | POA: Diagnosis not present

## 2019-10-23 DIAGNOSIS — F1120 Opioid dependence, uncomplicated: Secondary | ICD-10-CM | POA: Diagnosis not present

## 2019-11-06 DIAGNOSIS — I219 Acute myocardial infarction, unspecified: Secondary | ICD-10-CM | POA: Diagnosis not present

## 2019-11-06 DIAGNOSIS — J439 Emphysema, unspecified: Secondary | ICD-10-CM | POA: Diagnosis not present

## 2019-11-06 DIAGNOSIS — F19980 Other psychoactive substance use, unspecified with psychoactive substance-induced anxiety disorder: Secondary | ICD-10-CM | POA: Diagnosis not present

## 2019-11-06 DIAGNOSIS — F1120 Opioid dependence, uncomplicated: Secondary | ICD-10-CM | POA: Diagnosis not present

## 2019-11-06 DIAGNOSIS — E7800 Pure hypercholesterolemia, unspecified: Secondary | ICD-10-CM | POA: Diagnosis not present

## 2019-11-06 DIAGNOSIS — I2510 Atherosclerotic heart disease of native coronary artery without angina pectoris: Secondary | ICD-10-CM | POA: Diagnosis not present

## 2019-11-06 DIAGNOSIS — F1920 Other psychoactive substance dependence, uncomplicated: Secondary | ICD-10-CM | POA: Diagnosis not present

## 2019-11-06 DIAGNOSIS — M1990 Unspecified osteoarthritis, unspecified site: Secondary | ICD-10-CM | POA: Diagnosis not present

## 2019-11-06 DIAGNOSIS — Z6821 Body mass index (BMI) 21.0-21.9, adult: Secondary | ICD-10-CM | POA: Diagnosis not present

## 2019-11-13 DIAGNOSIS — Z01818 Encounter for other preprocedural examination: Secondary | ICD-10-CM | POA: Diagnosis not present

## 2019-11-13 DIAGNOSIS — H2511 Age-related nuclear cataract, right eye: Secondary | ICD-10-CM | POA: Diagnosis not present

## 2019-11-20 DIAGNOSIS — M1990 Unspecified osteoarthritis, unspecified site: Secondary | ICD-10-CM | POA: Diagnosis not present

## 2019-11-20 DIAGNOSIS — F19980 Other psychoactive substance use, unspecified with psychoactive substance-induced anxiety disorder: Secondary | ICD-10-CM | POA: Diagnosis not present

## 2019-11-20 DIAGNOSIS — I219 Acute myocardial infarction, unspecified: Secondary | ICD-10-CM | POA: Diagnosis not present

## 2019-11-20 DIAGNOSIS — F1120 Opioid dependence, uncomplicated: Secondary | ICD-10-CM | POA: Diagnosis not present

## 2019-11-20 DIAGNOSIS — F1920 Other psychoactive substance dependence, uncomplicated: Secondary | ICD-10-CM | POA: Diagnosis not present

## 2019-11-20 DIAGNOSIS — I2510 Atherosclerotic heart disease of native coronary artery without angina pectoris: Secondary | ICD-10-CM | POA: Diagnosis not present

## 2019-11-20 DIAGNOSIS — J439 Emphysema, unspecified: Secondary | ICD-10-CM | POA: Diagnosis not present

## 2019-11-20 DIAGNOSIS — E7800 Pure hypercholesterolemia, unspecified: Secondary | ICD-10-CM | POA: Diagnosis not present

## 2019-11-20 DIAGNOSIS — Z6821 Body mass index (BMI) 21.0-21.9, adult: Secondary | ICD-10-CM | POA: Diagnosis not present

## 2019-11-26 DIAGNOSIS — M79605 Pain in left leg: Secondary | ICD-10-CM | POA: Diagnosis not present

## 2019-11-26 DIAGNOSIS — M25462 Effusion, left knee: Secondary | ICD-10-CM | POA: Diagnosis not present

## 2019-12-03 DIAGNOSIS — Z6821 Body mass index (BMI) 21.0-21.9, adult: Secondary | ICD-10-CM | POA: Diagnosis not present

## 2019-12-03 DIAGNOSIS — I2510 Atherosclerotic heart disease of native coronary artery without angina pectoris: Secondary | ICD-10-CM | POA: Diagnosis not present

## 2019-12-03 DIAGNOSIS — F1120 Opioid dependence, uncomplicated: Secondary | ICD-10-CM | POA: Diagnosis not present

## 2019-12-03 DIAGNOSIS — F19980 Other psychoactive substance use, unspecified with psychoactive substance-induced anxiety disorder: Secondary | ICD-10-CM | POA: Diagnosis not present

## 2019-12-03 DIAGNOSIS — J439 Emphysema, unspecified: Secondary | ICD-10-CM | POA: Diagnosis not present

## 2019-12-03 DIAGNOSIS — M1990 Unspecified osteoarthritis, unspecified site: Secondary | ICD-10-CM | POA: Diagnosis not present

## 2019-12-03 DIAGNOSIS — E7800 Pure hypercholesterolemia, unspecified: Secondary | ICD-10-CM | POA: Diagnosis not present

## 2019-12-03 DIAGNOSIS — I219 Acute myocardial infarction, unspecified: Secondary | ICD-10-CM | POA: Diagnosis not present

## 2019-12-03 DIAGNOSIS — F1920 Other psychoactive substance dependence, uncomplicated: Secondary | ICD-10-CM | POA: Diagnosis not present

## 2019-12-04 DIAGNOSIS — I10 Essential (primary) hypertension: Secondary | ICD-10-CM | POA: Diagnosis not present

## 2019-12-04 DIAGNOSIS — F17210 Nicotine dependence, cigarettes, uncomplicated: Secondary | ICD-10-CM | POA: Diagnosis not present

## 2019-12-04 DIAGNOSIS — M549 Dorsalgia, unspecified: Secondary | ICD-10-CM | POA: Diagnosis not present

## 2019-12-04 DIAGNOSIS — G43909 Migraine, unspecified, not intractable, without status migrainosus: Secondary | ICD-10-CM | POA: Diagnosis not present

## 2019-12-04 DIAGNOSIS — M1990 Unspecified osteoarthritis, unspecified site: Secondary | ICD-10-CM | POA: Diagnosis not present

## 2019-12-04 DIAGNOSIS — E785 Hyperlipidemia, unspecified: Secondary | ICD-10-CM | POA: Diagnosis not present

## 2019-12-04 DIAGNOSIS — J449 Chronic obstructive pulmonary disease, unspecified: Secondary | ICD-10-CM | POA: Diagnosis not present

## 2019-12-04 DIAGNOSIS — H2511 Age-related nuclear cataract, right eye: Secondary | ICD-10-CM | POA: Diagnosis not present

## 2019-12-04 DIAGNOSIS — H259 Unspecified age-related cataract: Secondary | ICD-10-CM | POA: Diagnosis not present

## 2019-12-04 DIAGNOSIS — Z8701 Personal history of pneumonia (recurrent): Secondary | ICD-10-CM | POA: Diagnosis not present

## 2019-12-07 DIAGNOSIS — M79605 Pain in left leg: Secondary | ICD-10-CM | POA: Diagnosis not present

## 2019-12-07 DIAGNOSIS — S83232A Complex tear of medial meniscus, current injury, left knee, initial encounter: Secondary | ICD-10-CM | POA: Diagnosis not present

## 2019-12-07 DIAGNOSIS — M79652 Pain in left thigh: Secondary | ICD-10-CM | POA: Diagnosis not present

## 2019-12-12 DIAGNOSIS — M542 Cervicalgia: Secondary | ICD-10-CM | POA: Diagnosis not present

## 2019-12-12 DIAGNOSIS — I10 Essential (primary) hypertension: Secondary | ICD-10-CM | POA: Diagnosis not present

## 2019-12-12 DIAGNOSIS — M25562 Pain in left knee: Secondary | ICD-10-CM | POA: Diagnosis not present

## 2019-12-12 DIAGNOSIS — Z712 Person consulting for explanation of examination or test findings: Secondary | ICD-10-CM | POA: Diagnosis not present

## 2019-12-12 DIAGNOSIS — R29898 Other symptoms and signs involving the musculoskeletal system: Secondary | ICD-10-CM | POA: Diagnosis not present

## 2019-12-17 DIAGNOSIS — F1120 Opioid dependence, uncomplicated: Secondary | ICD-10-CM | POA: Diagnosis not present

## 2019-12-17 DIAGNOSIS — I2510 Atherosclerotic heart disease of native coronary artery without angina pectoris: Secondary | ICD-10-CM | POA: Diagnosis not present

## 2019-12-17 DIAGNOSIS — M1990 Unspecified osteoarthritis, unspecified site: Secondary | ICD-10-CM | POA: Diagnosis not present

## 2019-12-17 DIAGNOSIS — J439 Emphysema, unspecified: Secondary | ICD-10-CM | POA: Diagnosis not present

## 2019-12-17 DIAGNOSIS — F19980 Other psychoactive substance use, unspecified with psychoactive substance-induced anxiety disorder: Secondary | ICD-10-CM | POA: Diagnosis not present

## 2019-12-17 DIAGNOSIS — Z6821 Body mass index (BMI) 21.0-21.9, adult: Secondary | ICD-10-CM | POA: Diagnosis not present

## 2019-12-17 DIAGNOSIS — F1920 Other psychoactive substance dependence, uncomplicated: Secondary | ICD-10-CM | POA: Diagnosis not present

## 2019-12-17 DIAGNOSIS — I219 Acute myocardial infarction, unspecified: Secondary | ICD-10-CM | POA: Diagnosis not present

## 2019-12-17 DIAGNOSIS — E7800 Pure hypercholesterolemia, unspecified: Secondary | ICD-10-CM | POA: Diagnosis not present

## 2020-01-01 DIAGNOSIS — I219 Acute myocardial infarction, unspecified: Secondary | ICD-10-CM | POA: Diagnosis not present

## 2020-01-01 DIAGNOSIS — I2510 Atherosclerotic heart disease of native coronary artery without angina pectoris: Secondary | ICD-10-CM | POA: Diagnosis not present

## 2020-01-01 DIAGNOSIS — E7800 Pure hypercholesterolemia, unspecified: Secondary | ICD-10-CM | POA: Diagnosis not present

## 2020-01-01 DIAGNOSIS — F19980 Other psychoactive substance use, unspecified with psychoactive substance-induced anxiety disorder: Secondary | ICD-10-CM | POA: Diagnosis not present

## 2020-01-01 DIAGNOSIS — F1120 Opioid dependence, uncomplicated: Secondary | ICD-10-CM | POA: Diagnosis not present

## 2020-01-01 DIAGNOSIS — Z6821 Body mass index (BMI) 21.0-21.9, adult: Secondary | ICD-10-CM | POA: Diagnosis not present

## 2020-01-01 DIAGNOSIS — F1920 Other psychoactive substance dependence, uncomplicated: Secondary | ICD-10-CM | POA: Diagnosis not present

## 2020-01-01 DIAGNOSIS — J439 Emphysema, unspecified: Secondary | ICD-10-CM | POA: Diagnosis not present

## 2020-01-01 DIAGNOSIS — M1990 Unspecified osteoarthritis, unspecified site: Secondary | ICD-10-CM | POA: Diagnosis not present

## 2020-01-08 DIAGNOSIS — I252 Old myocardial infarction: Secondary | ICD-10-CM | POA: Diagnosis not present

## 2020-01-08 DIAGNOSIS — R29898 Other symptoms and signs involving the musculoskeletal system: Secondary | ICD-10-CM | POA: Diagnosis not present

## 2020-01-08 DIAGNOSIS — I10 Essential (primary) hypertension: Secondary | ICD-10-CM | POA: Diagnosis not present

## 2020-01-08 DIAGNOSIS — Z79899 Other long term (current) drug therapy: Secondary | ICD-10-CM | POA: Diagnosis not present

## 2020-01-08 DIAGNOSIS — F17210 Nicotine dependence, cigarettes, uncomplicated: Secondary | ICD-10-CM | POA: Diagnosis not present

## 2020-01-08 DIAGNOSIS — H2512 Age-related nuclear cataract, left eye: Secondary | ICD-10-CM | POA: Diagnosis not present

## 2020-01-08 DIAGNOSIS — M899 Disorder of bone, unspecified: Secondary | ICD-10-CM | POA: Diagnosis not present

## 2020-01-08 DIAGNOSIS — Z955 Presence of coronary angioplasty implant and graft: Secondary | ICD-10-CM | POA: Diagnosis not present

## 2020-01-08 DIAGNOSIS — E7800 Pure hypercholesterolemia, unspecified: Secondary | ICD-10-CM | POA: Diagnosis not present

## 2020-01-08 DIAGNOSIS — J439 Emphysema, unspecified: Secondary | ICD-10-CM | POA: Diagnosis not present

## 2020-01-08 DIAGNOSIS — J449 Chronic obstructive pulmonary disease, unspecified: Secondary | ICD-10-CM | POA: Diagnosis not present

## 2020-01-08 DIAGNOSIS — H259 Unspecified age-related cataract: Secondary | ICD-10-CM | POA: Diagnosis not present

## 2020-01-08 DIAGNOSIS — Z7982 Long term (current) use of aspirin: Secondary | ICD-10-CM | POA: Diagnosis not present

## 2020-01-08 DIAGNOSIS — R52 Pain, unspecified: Secondary | ICD-10-CM | POA: Diagnosis not present

## 2020-01-14 DIAGNOSIS — Z780 Asymptomatic menopausal state: Secondary | ICD-10-CM | POA: Diagnosis not present

## 2020-01-14 DIAGNOSIS — N959 Unspecified menopausal and perimenopausal disorder: Secondary | ICD-10-CM | POA: Diagnosis not present

## 2020-01-15 DIAGNOSIS — F1920 Other psychoactive substance dependence, uncomplicated: Secondary | ICD-10-CM | POA: Diagnosis not present

## 2020-01-15 DIAGNOSIS — I2510 Atherosclerotic heart disease of native coronary artery without angina pectoris: Secondary | ICD-10-CM | POA: Diagnosis not present

## 2020-01-15 DIAGNOSIS — F19980 Other psychoactive substance use, unspecified with psychoactive substance-induced anxiety disorder: Secondary | ICD-10-CM | POA: Diagnosis not present

## 2020-01-15 DIAGNOSIS — E7800 Pure hypercholesterolemia, unspecified: Secondary | ICD-10-CM | POA: Diagnosis not present

## 2020-01-15 DIAGNOSIS — F1120 Opioid dependence, uncomplicated: Secondary | ICD-10-CM | POA: Diagnosis not present

## 2020-01-15 DIAGNOSIS — J439 Emphysema, unspecified: Secondary | ICD-10-CM | POA: Diagnosis not present

## 2020-01-15 DIAGNOSIS — Z6821 Body mass index (BMI) 21.0-21.9, adult: Secondary | ICD-10-CM | POA: Diagnosis not present

## 2020-01-15 DIAGNOSIS — I219 Acute myocardial infarction, unspecified: Secondary | ICD-10-CM | POA: Diagnosis not present

## 2020-01-15 DIAGNOSIS — M1990 Unspecified osteoarthritis, unspecified site: Secondary | ICD-10-CM | POA: Diagnosis not present

## 2020-01-29 DIAGNOSIS — E7800 Pure hypercholesterolemia, unspecified: Secondary | ICD-10-CM | POA: Diagnosis not present

## 2020-01-29 DIAGNOSIS — Z6821 Body mass index (BMI) 21.0-21.9, adult: Secondary | ICD-10-CM | POA: Diagnosis not present

## 2020-01-29 DIAGNOSIS — F1120 Opioid dependence, uncomplicated: Secondary | ICD-10-CM | POA: Diagnosis not present

## 2020-01-29 DIAGNOSIS — F19980 Other psychoactive substance use, unspecified with psychoactive substance-induced anxiety disorder: Secondary | ICD-10-CM | POA: Diagnosis not present

## 2020-01-29 DIAGNOSIS — I219 Acute myocardial infarction, unspecified: Secondary | ICD-10-CM | POA: Diagnosis not present

## 2020-01-29 DIAGNOSIS — F1920 Other psychoactive substance dependence, uncomplicated: Secondary | ICD-10-CM | POA: Diagnosis not present

## 2020-01-29 DIAGNOSIS — I2510 Atherosclerotic heart disease of native coronary artery without angina pectoris: Secondary | ICD-10-CM | POA: Diagnosis not present

## 2020-01-29 DIAGNOSIS — J439 Emphysema, unspecified: Secondary | ICD-10-CM | POA: Diagnosis not present

## 2020-01-29 DIAGNOSIS — M1990 Unspecified osteoarthritis, unspecified site: Secondary | ICD-10-CM | POA: Diagnosis not present

## 2020-02-04 DIAGNOSIS — J449 Chronic obstructive pulmonary disease, unspecified: Secondary | ICD-10-CM | POA: Diagnosis not present

## 2020-02-04 DIAGNOSIS — R5383 Other fatigue: Secondary | ICD-10-CM | POA: Diagnosis not present

## 2020-02-04 DIAGNOSIS — G2581 Restless legs syndrome: Secondary | ICD-10-CM | POA: Diagnosis not present

## 2020-02-12 DIAGNOSIS — R5383 Other fatigue: Secondary | ICD-10-CM | POA: Diagnosis not present

## 2020-02-12 DIAGNOSIS — J439 Emphysema, unspecified: Secondary | ICD-10-CM | POA: Diagnosis not present

## 2020-02-12 DIAGNOSIS — I219 Acute myocardial infarction, unspecified: Secondary | ICD-10-CM | POA: Diagnosis not present

## 2020-02-12 DIAGNOSIS — R051 Acute cough: Secondary | ICD-10-CM | POA: Diagnosis not present

## 2020-02-12 DIAGNOSIS — F19980 Other psychoactive substance use, unspecified with psychoactive substance-induced anxiety disorder: Secondary | ICD-10-CM | POA: Diagnosis not present

## 2020-02-12 DIAGNOSIS — J449 Chronic obstructive pulmonary disease, unspecified: Secondary | ICD-10-CM | POA: Diagnosis not present

## 2020-02-12 DIAGNOSIS — R062 Wheezing: Secondary | ICD-10-CM | POA: Diagnosis not present

## 2020-02-12 DIAGNOSIS — G2581 Restless legs syndrome: Secondary | ICD-10-CM | POA: Diagnosis not present

## 2020-02-12 DIAGNOSIS — E7800 Pure hypercholesterolemia, unspecified: Secondary | ICD-10-CM | POA: Diagnosis not present

## 2020-02-12 DIAGNOSIS — F1920 Other psychoactive substance dependence, uncomplicated: Secondary | ICD-10-CM | POA: Diagnosis not present

## 2020-02-12 DIAGNOSIS — F1120 Opioid dependence, uncomplicated: Secondary | ICD-10-CM | POA: Diagnosis not present

## 2020-02-12 DIAGNOSIS — Z03818 Encounter for observation for suspected exposure to other biological agents ruled out: Secondary | ICD-10-CM | POA: Diagnosis not present

## 2020-02-12 DIAGNOSIS — Z6821 Body mass index (BMI) 21.0-21.9, adult: Secondary | ICD-10-CM | POA: Diagnosis not present

## 2020-02-12 DIAGNOSIS — M1990 Unspecified osteoarthritis, unspecified site: Secondary | ICD-10-CM | POA: Diagnosis not present

## 2020-02-12 DIAGNOSIS — I2510 Atherosclerotic heart disease of native coronary artery without angina pectoris: Secondary | ICD-10-CM | POA: Diagnosis not present

## 2020-02-13 DIAGNOSIS — J9811 Atelectasis: Secondary | ICD-10-CM | POA: Diagnosis not present

## 2020-02-13 DIAGNOSIS — I2510 Atherosclerotic heart disease of native coronary artery without angina pectoris: Secondary | ICD-10-CM | POA: Diagnosis not present

## 2020-02-13 DIAGNOSIS — I700 Atherosclerosis of aorta: Secondary | ICD-10-CM | POA: Diagnosis not present

## 2020-02-13 DIAGNOSIS — R0600 Dyspnea, unspecified: Secondary | ICD-10-CM | POA: Diagnosis not present

## 2020-02-13 DIAGNOSIS — J432 Centrilobular emphysema: Secondary | ICD-10-CM | POA: Diagnosis not present

## 2020-02-13 DIAGNOSIS — J449 Chronic obstructive pulmonary disease, unspecified: Secondary | ICD-10-CM | POA: Diagnosis not present

## 2020-02-13 DIAGNOSIS — R918 Other nonspecific abnormal finding of lung field: Secondary | ICD-10-CM | POA: Diagnosis not present

## 2020-02-13 DIAGNOSIS — I272 Pulmonary hypertension, unspecified: Secondary | ICD-10-CM | POA: Diagnosis not present

## 2020-02-14 DIAGNOSIS — G2581 Restless legs syndrome: Secondary | ICD-10-CM | POA: Diagnosis not present

## 2020-02-14 DIAGNOSIS — J449 Chronic obstructive pulmonary disease, unspecified: Secondary | ICD-10-CM | POA: Diagnosis not present

## 2020-02-14 DIAGNOSIS — R5383 Other fatigue: Secondary | ICD-10-CM | POA: Diagnosis not present

## 2020-02-14 DIAGNOSIS — J479 Bronchiectasis, uncomplicated: Secondary | ICD-10-CM | POA: Diagnosis not present

## 2020-02-19 DIAGNOSIS — Z6821 Body mass index (BMI) 21.0-21.9, adult: Secondary | ICD-10-CM | POA: Diagnosis not present

## 2020-02-19 DIAGNOSIS — F1920 Other psychoactive substance dependence, uncomplicated: Secondary | ICD-10-CM | POA: Diagnosis not present

## 2020-02-19 DIAGNOSIS — F1120 Opioid dependence, uncomplicated: Secondary | ICD-10-CM | POA: Diagnosis not present

## 2020-02-19 DIAGNOSIS — J439 Emphysema, unspecified: Secondary | ICD-10-CM | POA: Diagnosis not present

## 2020-02-19 DIAGNOSIS — I2510 Atherosclerotic heart disease of native coronary artery without angina pectoris: Secondary | ICD-10-CM | POA: Diagnosis not present

## 2020-02-19 DIAGNOSIS — I219 Acute myocardial infarction, unspecified: Secondary | ICD-10-CM | POA: Diagnosis not present

## 2020-02-19 DIAGNOSIS — M1990 Unspecified osteoarthritis, unspecified site: Secondary | ICD-10-CM | POA: Diagnosis not present

## 2020-02-19 DIAGNOSIS — F19980 Other psychoactive substance use, unspecified with psychoactive substance-induced anxiety disorder: Secondary | ICD-10-CM | POA: Diagnosis not present

## 2020-02-19 DIAGNOSIS — E7800 Pure hypercholesterolemia, unspecified: Secondary | ICD-10-CM | POA: Diagnosis not present

## 2020-02-26 DIAGNOSIS — G4733 Obstructive sleep apnea (adult) (pediatric): Secondary | ICD-10-CM | POA: Diagnosis not present

## 2020-02-26 DIAGNOSIS — J449 Chronic obstructive pulmonary disease, unspecified: Secondary | ICD-10-CM | POA: Diagnosis not present

## 2020-03-04 DIAGNOSIS — F1920 Other psychoactive substance dependence, uncomplicated: Secondary | ICD-10-CM | POA: Diagnosis not present

## 2020-03-04 DIAGNOSIS — E7800 Pure hypercholesterolemia, unspecified: Secondary | ICD-10-CM | POA: Diagnosis not present

## 2020-03-04 DIAGNOSIS — M1990 Unspecified osteoarthritis, unspecified site: Secondary | ICD-10-CM | POA: Diagnosis not present

## 2020-03-04 DIAGNOSIS — F1120 Opioid dependence, uncomplicated: Secondary | ICD-10-CM | POA: Diagnosis not present

## 2020-03-04 DIAGNOSIS — Z6821 Body mass index (BMI) 21.0-21.9, adult: Secondary | ICD-10-CM | POA: Diagnosis not present

## 2020-03-04 DIAGNOSIS — F19980 Other psychoactive substance use, unspecified with psychoactive substance-induced anxiety disorder: Secondary | ICD-10-CM | POA: Diagnosis not present

## 2020-03-04 DIAGNOSIS — I219 Acute myocardial infarction, unspecified: Secondary | ICD-10-CM | POA: Diagnosis not present

## 2020-03-04 DIAGNOSIS — I2510 Atherosclerotic heart disease of native coronary artery without angina pectoris: Secondary | ICD-10-CM | POA: Diagnosis not present

## 2020-03-04 DIAGNOSIS — J439 Emphysema, unspecified: Secondary | ICD-10-CM | POA: Diagnosis not present

## 2020-03-05 DIAGNOSIS — A4189 Other specified sepsis: Secondary | ICD-10-CM | POA: Diagnosis not present

## 2020-03-05 DIAGNOSIS — F1120 Opioid dependence, uncomplicated: Secondary | ICD-10-CM | POA: Diagnosis not present

## 2020-03-05 DIAGNOSIS — F17210 Nicotine dependence, cigarettes, uncomplicated: Secondary | ICD-10-CM | POA: Diagnosis not present

## 2020-03-05 DIAGNOSIS — R0600 Dyspnea, unspecified: Secondary | ICD-10-CM

## 2020-03-05 DIAGNOSIS — I2510 Atherosclerotic heart disease of native coronary artery without angina pectoris: Secondary | ICD-10-CM | POA: Diagnosis not present

## 2020-03-05 DIAGNOSIS — G8929 Other chronic pain: Secondary | ICD-10-CM | POA: Diagnosis not present

## 2020-03-05 DIAGNOSIS — R062 Wheezing: Secondary | ICD-10-CM | POA: Diagnosis not present

## 2020-03-05 DIAGNOSIS — J9601 Acute respiratory failure with hypoxia: Secondary | ICD-10-CM | POA: Diagnosis not present

## 2020-03-05 DIAGNOSIS — B9712 Echovirus as the cause of diseases classified elsewhere: Secondary | ICD-10-CM | POA: Diagnosis not present

## 2020-03-05 DIAGNOSIS — R0689 Other abnormalities of breathing: Secondary | ICD-10-CM | POA: Diagnosis not present

## 2020-03-05 DIAGNOSIS — M549 Dorsalgia, unspecified: Secondary | ICD-10-CM | POA: Diagnosis not present

## 2020-03-05 DIAGNOSIS — R000 Tachycardia, unspecified: Secondary | ICD-10-CM | POA: Diagnosis not present

## 2020-03-05 DIAGNOSIS — R0902 Hypoxemia: Secondary | ICD-10-CM | POA: Diagnosis not present

## 2020-03-05 DIAGNOSIS — J449 Chronic obstructive pulmonary disease, unspecified: Secondary | ICD-10-CM | POA: Diagnosis not present

## 2020-03-05 DIAGNOSIS — J440 Chronic obstructive pulmonary disease with acute lower respiratory infection: Secondary | ICD-10-CM | POA: Diagnosis not present

## 2020-03-05 DIAGNOSIS — R0602 Shortness of breath: Secondary | ICD-10-CM | POA: Diagnosis not present

## 2020-03-05 DIAGNOSIS — I10 Essential (primary) hypertension: Secondary | ICD-10-CM | POA: Diagnosis not present

## 2020-03-06 DIAGNOSIS — J449 Chronic obstructive pulmonary disease, unspecified: Secondary | ICD-10-CM | POA: Diagnosis not present

## 2020-03-06 DIAGNOSIS — I2510 Atherosclerotic heart disease of native coronary artery without angina pectoris: Secondary | ICD-10-CM | POA: Diagnosis not present

## 2020-03-06 DIAGNOSIS — J9601 Acute respiratory failure with hypoxia: Secondary | ICD-10-CM | POA: Diagnosis not present

## 2020-03-06 DIAGNOSIS — J440 Chronic obstructive pulmonary disease with acute lower respiratory infection: Secondary | ICD-10-CM | POA: Diagnosis not present

## 2020-03-14 DIAGNOSIS — H524 Presbyopia: Secondary | ICD-10-CM | POA: Diagnosis not present

## 2020-03-14 DIAGNOSIS — Z09 Encounter for follow-up examination after completed treatment for conditions other than malignant neoplasm: Secondary | ICD-10-CM | POA: Diagnosis not present

## 2020-03-18 DIAGNOSIS — F1920 Other psychoactive substance dependence, uncomplicated: Secondary | ICD-10-CM | POA: Diagnosis not present

## 2020-03-18 DIAGNOSIS — E7800 Pure hypercholesterolemia, unspecified: Secondary | ICD-10-CM | POA: Diagnosis not present

## 2020-03-18 DIAGNOSIS — M1990 Unspecified osteoarthritis, unspecified site: Secondary | ICD-10-CM | POA: Diagnosis not present

## 2020-03-18 DIAGNOSIS — I2510 Atherosclerotic heart disease of native coronary artery without angina pectoris: Secondary | ICD-10-CM | POA: Diagnosis not present

## 2020-03-18 DIAGNOSIS — F19980 Other psychoactive substance use, unspecified with psychoactive substance-induced anxiety disorder: Secondary | ICD-10-CM | POA: Diagnosis not present

## 2020-03-18 DIAGNOSIS — Z6821 Body mass index (BMI) 21.0-21.9, adult: Secondary | ICD-10-CM | POA: Diagnosis not present

## 2020-03-18 DIAGNOSIS — F1120 Opioid dependence, uncomplicated: Secondary | ICD-10-CM | POA: Diagnosis not present

## 2020-03-18 DIAGNOSIS — J439 Emphysema, unspecified: Secondary | ICD-10-CM | POA: Diagnosis not present

## 2020-03-18 DIAGNOSIS — I219 Acute myocardial infarction, unspecified: Secondary | ICD-10-CM | POA: Diagnosis not present

## 2020-03-27 DIAGNOSIS — J449 Chronic obstructive pulmonary disease, unspecified: Secondary | ICD-10-CM | POA: Diagnosis not present

## 2020-04-01 DIAGNOSIS — Z6821 Body mass index (BMI) 21.0-21.9, adult: Secondary | ICD-10-CM | POA: Diagnosis not present

## 2020-04-01 DIAGNOSIS — I219 Acute myocardial infarction, unspecified: Secondary | ICD-10-CM | POA: Diagnosis not present

## 2020-04-01 DIAGNOSIS — E7800 Pure hypercholesterolemia, unspecified: Secondary | ICD-10-CM | POA: Diagnosis not present

## 2020-04-01 DIAGNOSIS — F1120 Opioid dependence, uncomplicated: Secondary | ICD-10-CM | POA: Diagnosis not present

## 2020-04-01 DIAGNOSIS — I2510 Atherosclerotic heart disease of native coronary artery without angina pectoris: Secondary | ICD-10-CM | POA: Diagnosis not present

## 2020-04-01 DIAGNOSIS — F1920 Other psychoactive substance dependence, uncomplicated: Secondary | ICD-10-CM | POA: Diagnosis not present

## 2020-04-01 DIAGNOSIS — J439 Emphysema, unspecified: Secondary | ICD-10-CM | POA: Diagnosis not present

## 2020-04-01 DIAGNOSIS — M1990 Unspecified osteoarthritis, unspecified site: Secondary | ICD-10-CM | POA: Diagnosis not present

## 2020-04-01 DIAGNOSIS — F19980 Other psychoactive substance use, unspecified with psychoactive substance-induced anxiety disorder: Secondary | ICD-10-CM | POA: Diagnosis not present

## 2020-04-05 DIAGNOSIS — J449 Chronic obstructive pulmonary disease, unspecified: Secondary | ICD-10-CM | POA: Diagnosis not present

## 2020-04-15 DIAGNOSIS — E7800 Pure hypercholesterolemia, unspecified: Secondary | ICD-10-CM | POA: Diagnosis not present

## 2020-04-15 DIAGNOSIS — I219 Acute myocardial infarction, unspecified: Secondary | ICD-10-CM | POA: Diagnosis not present

## 2020-04-15 DIAGNOSIS — F1120 Opioid dependence, uncomplicated: Secondary | ICD-10-CM | POA: Diagnosis not present

## 2020-04-15 DIAGNOSIS — M1990 Unspecified osteoarthritis, unspecified site: Secondary | ICD-10-CM | POA: Diagnosis not present

## 2020-04-15 DIAGNOSIS — Z6821 Body mass index (BMI) 21.0-21.9, adult: Secondary | ICD-10-CM | POA: Diagnosis not present

## 2020-04-15 DIAGNOSIS — I2510 Atherosclerotic heart disease of native coronary artery without angina pectoris: Secondary | ICD-10-CM | POA: Diagnosis not present

## 2020-04-15 DIAGNOSIS — F1920 Other psychoactive substance dependence, uncomplicated: Secondary | ICD-10-CM | POA: Diagnosis not present

## 2020-04-15 DIAGNOSIS — J439 Emphysema, unspecified: Secondary | ICD-10-CM | POA: Diagnosis not present

## 2020-04-15 DIAGNOSIS — F19980 Other psychoactive substance use, unspecified with psychoactive substance-induced anxiety disorder: Secondary | ICD-10-CM | POA: Diagnosis not present

## 2020-04-18 DIAGNOSIS — J9611 Chronic respiratory failure with hypoxia: Secondary | ICD-10-CM | POA: Diagnosis not present

## 2020-04-18 DIAGNOSIS — R0602 Shortness of breath: Secondary | ICD-10-CM | POA: Diagnosis not present

## 2020-04-18 DIAGNOSIS — G8929 Other chronic pain: Secondary | ICD-10-CM | POA: Diagnosis not present

## 2020-04-18 DIAGNOSIS — E785 Hyperlipidemia, unspecified: Secondary | ICD-10-CM | POA: Diagnosis not present

## 2020-04-18 DIAGNOSIS — Z7952 Long term (current) use of systemic steroids: Secondary | ICD-10-CM | POA: Diagnosis not present

## 2020-04-18 DIAGNOSIS — R069 Unspecified abnormalities of breathing: Secondary | ICD-10-CM | POA: Diagnosis not present

## 2020-04-18 DIAGNOSIS — R0600 Dyspnea, unspecified: Secondary | ICD-10-CM | POA: Diagnosis not present

## 2020-04-18 DIAGNOSIS — I2510 Atherosclerotic heart disease of native coronary artery without angina pectoris: Secondary | ICD-10-CM | POA: Diagnosis not present

## 2020-04-18 DIAGNOSIS — R457 State of emotional shock and stress, unspecified: Secondary | ICD-10-CM | POA: Diagnosis not present

## 2020-04-18 DIAGNOSIS — Z23 Encounter for immunization: Secondary | ICD-10-CM | POA: Diagnosis not present

## 2020-04-18 DIAGNOSIS — I10 Essential (primary) hypertension: Secondary | ICD-10-CM | POA: Diagnosis not present

## 2020-04-18 DIAGNOSIS — J439 Emphysema, unspecified: Secondary | ICD-10-CM | POA: Diagnosis not present

## 2020-04-18 DIAGNOSIS — R778 Other specified abnormalities of plasma proteins: Secondary | ICD-10-CM | POA: Diagnosis not present

## 2020-04-18 DIAGNOSIS — G629 Polyneuropathy, unspecified: Secondary | ICD-10-CM | POA: Diagnosis not present

## 2020-04-18 DIAGNOSIS — E871 Hypo-osmolality and hyponatremia: Secondary | ICD-10-CM | POA: Diagnosis not present

## 2020-04-18 DIAGNOSIS — R000 Tachycardia, unspecified: Secondary | ICD-10-CM | POA: Diagnosis not present

## 2020-04-18 DIAGNOSIS — J441 Chronic obstructive pulmonary disease with (acute) exacerbation: Secondary | ICD-10-CM | POA: Diagnosis not present

## 2020-04-19 DIAGNOSIS — E871 Hypo-osmolality and hyponatremia: Secondary | ICD-10-CM | POA: Diagnosis not present

## 2020-04-19 DIAGNOSIS — J441 Chronic obstructive pulmonary disease with (acute) exacerbation: Secondary | ICD-10-CM | POA: Diagnosis not present

## 2020-04-19 DIAGNOSIS — J9611 Chronic respiratory failure with hypoxia: Secondary | ICD-10-CM | POA: Diagnosis not present

## 2020-04-19 DIAGNOSIS — R0602 Shortness of breath: Secondary | ICD-10-CM

## 2020-04-20 DIAGNOSIS — J441 Chronic obstructive pulmonary disease with (acute) exacerbation: Secondary | ICD-10-CM | POA: Diagnosis not present

## 2020-04-20 DIAGNOSIS — E871 Hypo-osmolality and hyponatremia: Secondary | ICD-10-CM | POA: Diagnosis not present

## 2020-04-20 DIAGNOSIS — J9611 Chronic respiratory failure with hypoxia: Secondary | ICD-10-CM | POA: Diagnosis not present

## 2020-04-24 DIAGNOSIS — J449 Chronic obstructive pulmonary disease, unspecified: Secondary | ICD-10-CM | POA: Diagnosis not present

## 2020-04-24 DIAGNOSIS — I10 Essential (primary) hypertension: Secondary | ICD-10-CM | POA: Diagnosis not present

## 2020-04-24 DIAGNOSIS — M545 Low back pain, unspecified: Secondary | ICD-10-CM | POA: Diagnosis not present

## 2020-04-24 DIAGNOSIS — Z760 Encounter for issue of repeat prescription: Secondary | ICD-10-CM | POA: Diagnosis not present

## 2020-04-24 DIAGNOSIS — R29898 Other symptoms and signs involving the musculoskeletal system: Secondary | ICD-10-CM | POA: Diagnosis not present

## 2020-04-25 DIAGNOSIS — G2581 Restless legs syndrome: Secondary | ICD-10-CM | POA: Diagnosis not present

## 2020-04-25 DIAGNOSIS — Z79899 Other long term (current) drug therapy: Secondary | ICD-10-CM | POA: Diagnosis not present

## 2020-04-25 DIAGNOSIS — J479 Bronchiectasis, uncomplicated: Secondary | ICD-10-CM | POA: Diagnosis not present

## 2020-04-25 DIAGNOSIS — F411 Generalized anxiety disorder: Secondary | ICD-10-CM | POA: Diagnosis not present

## 2020-04-25 DIAGNOSIS — R5383 Other fatigue: Secondary | ICD-10-CM | POA: Diagnosis not present

## 2020-04-25 DIAGNOSIS — Z7189 Other specified counseling: Secondary | ICD-10-CM | POA: Diagnosis not present

## 2020-04-25 DIAGNOSIS — J449 Chronic obstructive pulmonary disease, unspecified: Secondary | ICD-10-CM | POA: Diagnosis not present

## 2020-04-27 DIAGNOSIS — J449 Chronic obstructive pulmonary disease, unspecified: Secondary | ICD-10-CM | POA: Diagnosis not present

## 2020-04-29 DIAGNOSIS — F1120 Opioid dependence, uncomplicated: Secondary | ICD-10-CM | POA: Diagnosis not present

## 2020-04-29 DIAGNOSIS — I219 Acute myocardial infarction, unspecified: Secondary | ICD-10-CM | POA: Diagnosis not present

## 2020-04-29 DIAGNOSIS — M1990 Unspecified osteoarthritis, unspecified site: Secondary | ICD-10-CM | POA: Diagnosis not present

## 2020-04-29 DIAGNOSIS — F19980 Other psychoactive substance use, unspecified with psychoactive substance-induced anxiety disorder: Secondary | ICD-10-CM | POA: Diagnosis not present

## 2020-04-29 DIAGNOSIS — Z6821 Body mass index (BMI) 21.0-21.9, adult: Secondary | ICD-10-CM | POA: Diagnosis not present

## 2020-04-29 DIAGNOSIS — E7800 Pure hypercholesterolemia, unspecified: Secondary | ICD-10-CM | POA: Diagnosis not present

## 2020-04-29 DIAGNOSIS — F1920 Other psychoactive substance dependence, uncomplicated: Secondary | ICD-10-CM | POA: Diagnosis not present

## 2020-04-29 DIAGNOSIS — J439 Emphysema, unspecified: Secondary | ICD-10-CM | POA: Diagnosis not present

## 2020-04-29 DIAGNOSIS — I2510 Atherosclerotic heart disease of native coronary artery without angina pectoris: Secondary | ICD-10-CM | POA: Diagnosis not present

## 2020-05-06 DIAGNOSIS — J449 Chronic obstructive pulmonary disease, unspecified: Secondary | ICD-10-CM | POA: Diagnosis not present

## 2020-05-09 DIAGNOSIS — F411 Generalized anxiety disorder: Secondary | ICD-10-CM | POA: Diagnosis not present

## 2020-05-09 DIAGNOSIS — Z23 Encounter for immunization: Secondary | ICD-10-CM | POA: Diagnosis not present

## 2020-05-09 DIAGNOSIS — R5383 Other fatigue: Secondary | ICD-10-CM | POA: Diagnosis not present

## 2020-05-09 DIAGNOSIS — G2581 Restless legs syndrome: Secondary | ICD-10-CM | POA: Diagnosis not present

## 2020-05-09 DIAGNOSIS — Z7189 Other specified counseling: Secondary | ICD-10-CM | POA: Diagnosis not present

## 2020-05-09 DIAGNOSIS — J449 Chronic obstructive pulmonary disease, unspecified: Secondary | ICD-10-CM | POA: Diagnosis not present

## 2020-05-09 DIAGNOSIS — J479 Bronchiectasis, uncomplicated: Secondary | ICD-10-CM | POA: Diagnosis not present

## 2020-05-13 DIAGNOSIS — J439 Emphysema, unspecified: Secondary | ICD-10-CM | POA: Diagnosis not present

## 2020-05-13 DIAGNOSIS — I219 Acute myocardial infarction, unspecified: Secondary | ICD-10-CM | POA: Diagnosis not present

## 2020-05-13 DIAGNOSIS — E7800 Pure hypercholesterolemia, unspecified: Secondary | ICD-10-CM | POA: Diagnosis not present

## 2020-05-13 DIAGNOSIS — F19980 Other psychoactive substance use, unspecified with psychoactive substance-induced anxiety disorder: Secondary | ICD-10-CM | POA: Diagnosis not present

## 2020-05-13 DIAGNOSIS — F1920 Other psychoactive substance dependence, uncomplicated: Secondary | ICD-10-CM | POA: Diagnosis not present

## 2020-05-13 DIAGNOSIS — F1120 Opioid dependence, uncomplicated: Secondary | ICD-10-CM | POA: Diagnosis not present

## 2020-05-13 DIAGNOSIS — M1990 Unspecified osteoarthritis, unspecified site: Secondary | ICD-10-CM | POA: Diagnosis not present

## 2020-05-13 DIAGNOSIS — I2510 Atherosclerotic heart disease of native coronary artery without angina pectoris: Secondary | ICD-10-CM | POA: Diagnosis not present

## 2020-05-13 DIAGNOSIS — Z6821 Body mass index (BMI) 21.0-21.9, adult: Secondary | ICD-10-CM | POA: Diagnosis not present

## 2020-05-27 DIAGNOSIS — M1990 Unspecified osteoarthritis, unspecified site: Secondary | ICD-10-CM | POA: Diagnosis not present

## 2020-05-27 DIAGNOSIS — E7800 Pure hypercholesterolemia, unspecified: Secondary | ICD-10-CM | POA: Diagnosis not present

## 2020-05-27 DIAGNOSIS — Z6821 Body mass index (BMI) 21.0-21.9, adult: Secondary | ICD-10-CM | POA: Diagnosis not present

## 2020-05-27 DIAGNOSIS — I2510 Atherosclerotic heart disease of native coronary artery without angina pectoris: Secondary | ICD-10-CM | POA: Diagnosis not present

## 2020-05-27 DIAGNOSIS — I219 Acute myocardial infarction, unspecified: Secondary | ICD-10-CM | POA: Diagnosis not present

## 2020-05-27 DIAGNOSIS — J439 Emphysema, unspecified: Secondary | ICD-10-CM | POA: Diagnosis not present

## 2020-05-27 DIAGNOSIS — F19980 Other psychoactive substance use, unspecified with psychoactive substance-induced anxiety disorder: Secondary | ICD-10-CM | POA: Diagnosis not present

## 2020-05-27 DIAGNOSIS — F1920 Other psychoactive substance dependence, uncomplicated: Secondary | ICD-10-CM | POA: Diagnosis not present

## 2020-05-27 DIAGNOSIS — F1120 Opioid dependence, uncomplicated: Secondary | ICD-10-CM | POA: Diagnosis not present

## 2020-05-28 DIAGNOSIS — J449 Chronic obstructive pulmonary disease, unspecified: Secondary | ICD-10-CM | POA: Diagnosis not present

## 2020-06-06 DIAGNOSIS — J449 Chronic obstructive pulmonary disease, unspecified: Secondary | ICD-10-CM | POA: Diagnosis not present

## 2020-06-10 DIAGNOSIS — F19980 Other psychoactive substance use, unspecified with psychoactive substance-induced anxiety disorder: Secondary | ICD-10-CM | POA: Diagnosis not present

## 2020-06-10 DIAGNOSIS — F1120 Opioid dependence, uncomplicated: Secondary | ICD-10-CM | POA: Diagnosis not present

## 2020-06-10 DIAGNOSIS — M1990 Unspecified osteoarthritis, unspecified site: Secondary | ICD-10-CM | POA: Diagnosis not present

## 2020-06-10 DIAGNOSIS — Z6821 Body mass index (BMI) 21.0-21.9, adult: Secondary | ICD-10-CM | POA: Diagnosis not present

## 2020-06-10 DIAGNOSIS — F1920 Other psychoactive substance dependence, uncomplicated: Secondary | ICD-10-CM | POA: Diagnosis not present

## 2020-06-10 DIAGNOSIS — E7800 Pure hypercholesterolemia, unspecified: Secondary | ICD-10-CM | POA: Diagnosis not present

## 2020-06-10 DIAGNOSIS — J439 Emphysema, unspecified: Secondary | ICD-10-CM | POA: Diagnosis not present

## 2020-06-10 DIAGNOSIS — I2510 Atherosclerotic heart disease of native coronary artery without angina pectoris: Secondary | ICD-10-CM | POA: Diagnosis not present

## 2020-06-10 DIAGNOSIS — I219 Acute myocardial infarction, unspecified: Secondary | ICD-10-CM | POA: Diagnosis not present

## 2020-06-17 DIAGNOSIS — Z6821 Body mass index (BMI) 21.0-21.9, adult: Secondary | ICD-10-CM | POA: Diagnosis not present

## 2020-06-17 DIAGNOSIS — F19980 Other psychoactive substance use, unspecified with psychoactive substance-induced anxiety disorder: Secondary | ICD-10-CM | POA: Diagnosis not present

## 2020-06-17 DIAGNOSIS — J439 Emphysema, unspecified: Secondary | ICD-10-CM | POA: Diagnosis not present

## 2020-06-17 DIAGNOSIS — E7800 Pure hypercholesterolemia, unspecified: Secondary | ICD-10-CM | POA: Diagnosis not present

## 2020-06-17 DIAGNOSIS — M1990 Unspecified osteoarthritis, unspecified site: Secondary | ICD-10-CM | POA: Diagnosis not present

## 2020-06-17 DIAGNOSIS — F1120 Opioid dependence, uncomplicated: Secondary | ICD-10-CM | POA: Diagnosis not present

## 2020-06-17 DIAGNOSIS — I2510 Atherosclerotic heart disease of native coronary artery without angina pectoris: Secondary | ICD-10-CM | POA: Diagnosis not present

## 2020-06-17 DIAGNOSIS — I219 Acute myocardial infarction, unspecified: Secondary | ICD-10-CM | POA: Diagnosis not present

## 2020-06-17 DIAGNOSIS — F1920 Other psychoactive substance dependence, uncomplicated: Secondary | ICD-10-CM | POA: Diagnosis not present

## 2020-06-25 DIAGNOSIS — J449 Chronic obstructive pulmonary disease, unspecified: Secondary | ICD-10-CM | POA: Diagnosis not present

## 2020-07-01 DIAGNOSIS — M1990 Unspecified osteoarthritis, unspecified site: Secondary | ICD-10-CM | POA: Diagnosis not present

## 2020-07-01 DIAGNOSIS — E7800 Pure hypercholesterolemia, unspecified: Secondary | ICD-10-CM | POA: Diagnosis not present

## 2020-07-01 DIAGNOSIS — Z6820 Body mass index (BMI) 20.0-20.9, adult: Secondary | ICD-10-CM | POA: Diagnosis not present

## 2020-07-01 DIAGNOSIS — F1920 Other psychoactive substance dependence, uncomplicated: Secondary | ICD-10-CM | POA: Diagnosis not present

## 2020-07-01 DIAGNOSIS — I2510 Atherosclerotic heart disease of native coronary artery without angina pectoris: Secondary | ICD-10-CM | POA: Diagnosis not present

## 2020-07-01 DIAGNOSIS — F19980 Other psychoactive substance use, unspecified with psychoactive substance-induced anxiety disorder: Secondary | ICD-10-CM | POA: Diagnosis not present

## 2020-07-01 DIAGNOSIS — J439 Emphysema, unspecified: Secondary | ICD-10-CM | POA: Diagnosis not present

## 2020-07-01 DIAGNOSIS — I219 Acute myocardial infarction, unspecified: Secondary | ICD-10-CM | POA: Diagnosis not present

## 2020-07-01 DIAGNOSIS — F1120 Opioid dependence, uncomplicated: Secondary | ICD-10-CM | POA: Diagnosis not present

## 2020-07-04 DIAGNOSIS — J449 Chronic obstructive pulmonary disease, unspecified: Secondary | ICD-10-CM | POA: Diagnosis not present

## 2020-07-15 DIAGNOSIS — M1990 Unspecified osteoarthritis, unspecified site: Secondary | ICD-10-CM | POA: Diagnosis not present

## 2020-07-15 DIAGNOSIS — Z6820 Body mass index (BMI) 20.0-20.9, adult: Secondary | ICD-10-CM | POA: Diagnosis not present

## 2020-07-15 DIAGNOSIS — I2510 Atherosclerotic heart disease of native coronary artery without angina pectoris: Secondary | ICD-10-CM | POA: Diagnosis not present

## 2020-07-15 DIAGNOSIS — E7800 Pure hypercholesterolemia, unspecified: Secondary | ICD-10-CM | POA: Diagnosis not present

## 2020-07-15 DIAGNOSIS — F1120 Opioid dependence, uncomplicated: Secondary | ICD-10-CM | POA: Diagnosis not present

## 2020-07-15 DIAGNOSIS — I219 Acute myocardial infarction, unspecified: Secondary | ICD-10-CM | POA: Diagnosis not present

## 2020-07-15 DIAGNOSIS — J439 Emphysema, unspecified: Secondary | ICD-10-CM | POA: Diagnosis not present

## 2020-07-15 DIAGNOSIS — F19980 Other psychoactive substance use, unspecified with psychoactive substance-induced anxiety disorder: Secondary | ICD-10-CM | POA: Diagnosis not present

## 2020-07-15 DIAGNOSIS — F1920 Other psychoactive substance dependence, uncomplicated: Secondary | ICD-10-CM | POA: Diagnosis not present

## 2020-07-22 DIAGNOSIS — G2581 Restless legs syndrome: Secondary | ICD-10-CM | POA: Diagnosis not present

## 2020-07-22 DIAGNOSIS — F411 Generalized anxiety disorder: Secondary | ICD-10-CM | POA: Diagnosis not present

## 2020-07-22 DIAGNOSIS — R5383 Other fatigue: Secondary | ICD-10-CM | POA: Diagnosis not present

## 2020-07-22 DIAGNOSIS — J441 Chronic obstructive pulmonary disease with (acute) exacerbation: Secondary | ICD-10-CM | POA: Diagnosis not present

## 2020-07-22 DIAGNOSIS — J479 Bronchiectasis, uncomplicated: Secondary | ICD-10-CM | POA: Diagnosis not present

## 2020-07-23 DIAGNOSIS — J441 Chronic obstructive pulmonary disease with (acute) exacerbation: Secondary | ICD-10-CM | POA: Diagnosis not present

## 2020-07-23 DIAGNOSIS — J479 Bronchiectasis, uncomplicated: Secondary | ICD-10-CM | POA: Diagnosis not present

## 2020-07-26 DIAGNOSIS — J449 Chronic obstructive pulmonary disease, unspecified: Secondary | ICD-10-CM | POA: Diagnosis not present

## 2020-07-30 DIAGNOSIS — F1120 Opioid dependence, uncomplicated: Secondary | ICD-10-CM | POA: Diagnosis not present

## 2020-07-30 DIAGNOSIS — I2510 Atherosclerotic heart disease of native coronary artery without angina pectoris: Secondary | ICD-10-CM | POA: Diagnosis not present

## 2020-07-30 DIAGNOSIS — I219 Acute myocardial infarction, unspecified: Secondary | ICD-10-CM | POA: Diagnosis not present

## 2020-07-30 DIAGNOSIS — J439 Emphysema, unspecified: Secondary | ICD-10-CM | POA: Diagnosis not present

## 2020-07-30 DIAGNOSIS — E7800 Pure hypercholesterolemia, unspecified: Secondary | ICD-10-CM | POA: Diagnosis not present

## 2020-07-30 DIAGNOSIS — F1920 Other psychoactive substance dependence, uncomplicated: Secondary | ICD-10-CM | POA: Diagnosis not present

## 2020-07-30 DIAGNOSIS — M1990 Unspecified osteoarthritis, unspecified site: Secondary | ICD-10-CM | POA: Diagnosis not present

## 2020-07-30 DIAGNOSIS — F19980 Other psychoactive substance use, unspecified with psychoactive substance-induced anxiety disorder: Secondary | ICD-10-CM | POA: Diagnosis not present

## 2020-07-30 DIAGNOSIS — Z6820 Body mass index (BMI) 20.0-20.9, adult: Secondary | ICD-10-CM | POA: Diagnosis not present

## 2020-08-04 DIAGNOSIS — J449 Chronic obstructive pulmonary disease, unspecified: Secondary | ICD-10-CM | POA: Diagnosis not present

## 2020-08-06 DIAGNOSIS — E7800 Pure hypercholesterolemia, unspecified: Secondary | ICD-10-CM | POA: Diagnosis not present

## 2020-08-06 DIAGNOSIS — F19980 Other psychoactive substance use, unspecified with psychoactive substance-induced anxiety disorder: Secondary | ICD-10-CM | POA: Diagnosis not present

## 2020-08-06 DIAGNOSIS — F1920 Other psychoactive substance dependence, uncomplicated: Secondary | ICD-10-CM | POA: Diagnosis not present

## 2020-08-06 DIAGNOSIS — Z6820 Body mass index (BMI) 20.0-20.9, adult: Secondary | ICD-10-CM | POA: Diagnosis not present

## 2020-08-06 DIAGNOSIS — I219 Acute myocardial infarction, unspecified: Secondary | ICD-10-CM | POA: Diagnosis not present

## 2020-08-06 DIAGNOSIS — J439 Emphysema, unspecified: Secondary | ICD-10-CM | POA: Diagnosis not present

## 2020-08-06 DIAGNOSIS — F1120 Opioid dependence, uncomplicated: Secondary | ICD-10-CM | POA: Diagnosis not present

## 2020-08-06 DIAGNOSIS — I2510 Atherosclerotic heart disease of native coronary artery without angina pectoris: Secondary | ICD-10-CM | POA: Diagnosis not present

## 2020-08-06 DIAGNOSIS — M1990 Unspecified osteoarthritis, unspecified site: Secondary | ICD-10-CM | POA: Diagnosis not present

## 2020-08-13 DIAGNOSIS — G2581 Restless legs syndrome: Secondary | ICD-10-CM | POA: Diagnosis not present

## 2020-08-13 DIAGNOSIS — R5383 Other fatigue: Secondary | ICD-10-CM | POA: Diagnosis not present

## 2020-08-13 DIAGNOSIS — J479 Bronchiectasis, uncomplicated: Secondary | ICD-10-CM | POA: Diagnosis not present

## 2020-08-13 DIAGNOSIS — J441 Chronic obstructive pulmonary disease with (acute) exacerbation: Secondary | ICD-10-CM | POA: Diagnosis not present

## 2020-08-13 DIAGNOSIS — F411 Generalized anxiety disorder: Secondary | ICD-10-CM | POA: Diagnosis not present

## 2020-08-14 DIAGNOSIS — E785 Hyperlipidemia, unspecified: Secondary | ICD-10-CM | POA: Diagnosis not present

## 2020-08-14 DIAGNOSIS — M545 Low back pain, unspecified: Secondary | ICD-10-CM | POA: Diagnosis not present

## 2020-08-14 DIAGNOSIS — Z760 Encounter for issue of repeat prescription: Secondary | ICD-10-CM | POA: Diagnosis not present

## 2020-08-14 DIAGNOSIS — J449 Chronic obstructive pulmonary disease, unspecified: Secondary | ICD-10-CM | POA: Diagnosis not present

## 2020-08-14 DIAGNOSIS — Z79899 Other long term (current) drug therapy: Secondary | ICD-10-CM | POA: Diagnosis not present

## 2020-08-14 DIAGNOSIS — R29898 Other symptoms and signs involving the musculoskeletal system: Secondary | ICD-10-CM | POA: Diagnosis not present

## 2020-08-14 DIAGNOSIS — I10 Essential (primary) hypertension: Secondary | ICD-10-CM | POA: Diagnosis not present

## 2020-08-20 DIAGNOSIS — E7800 Pure hypercholesterolemia, unspecified: Secondary | ICD-10-CM | POA: Diagnosis not present

## 2020-08-20 DIAGNOSIS — I219 Acute myocardial infarction, unspecified: Secondary | ICD-10-CM | POA: Diagnosis not present

## 2020-08-20 DIAGNOSIS — M1990 Unspecified osteoarthritis, unspecified site: Secondary | ICD-10-CM | POA: Diagnosis not present

## 2020-08-20 DIAGNOSIS — Z6820 Body mass index (BMI) 20.0-20.9, adult: Secondary | ICD-10-CM | POA: Diagnosis not present

## 2020-08-20 DIAGNOSIS — I2510 Atherosclerotic heart disease of native coronary artery without angina pectoris: Secondary | ICD-10-CM | POA: Diagnosis not present

## 2020-08-20 DIAGNOSIS — F1920 Other psychoactive substance dependence, uncomplicated: Secondary | ICD-10-CM | POA: Diagnosis not present

## 2020-08-20 DIAGNOSIS — J439 Emphysema, unspecified: Secondary | ICD-10-CM | POA: Diagnosis not present

## 2020-08-20 DIAGNOSIS — F1120 Opioid dependence, uncomplicated: Secondary | ICD-10-CM | POA: Diagnosis not present
